# Patient Record
Sex: Female | Born: 1970 | Race: Black or African American | Hispanic: No | Marital: Single | Smoking: Never smoker
Health system: Southern US, Community
[De-identification: ages and names within clinical notes are randomized; demographics above are authoritative.]

## PROBLEM LIST (undated history)

## (undated) DIAGNOSIS — D649 Anemia, unspecified: Secondary | ICD-10-CM

## (undated) DIAGNOSIS — T8859XA Other complications of anesthesia, initial encounter: Secondary | ICD-10-CM

## (undated) HISTORY — PX: IUD REMOVAL: SHX5392

## (undated) HISTORY — PX: ENDOMETRIAL BIOPSY: SHX622

## (undated) HISTORY — PX: COLONOSCOPY: SHX174

## (undated) HISTORY — DX: Anemia, unspecified: D64.9

---

## 2015-03-06 ENCOUNTER — Other Ambulatory Visit: Payer: Self-pay

## 2015-03-06 ENCOUNTER — Other Ambulatory Visit: Payer: Self-pay | Admitting: Nurse Practitioner

## 2015-03-06 DIAGNOSIS — Z1231 Encounter for screening mammogram for malignant neoplasm of breast: Secondary | ICD-10-CM

## 2015-03-12 ENCOUNTER — Ambulatory Visit
Admission: RE | Admit: 2015-03-12 | Discharge: 2015-03-12 | Disposition: A | Discharge status: Home / Self Care | Payer: Non-veteran care | Source: Ambulatory Visit

## 2015-03-12 DIAGNOSIS — Z1231 Encounter for screening mammogram for malignant neoplasm of breast: Secondary | ICD-10-CM

## 2016-04-03 ENCOUNTER — Other Ambulatory Visit (HOSPITAL_COMMUNITY): Payer: Self-pay | Admitting: Physician Assistant

## 2016-04-03 DIAGNOSIS — Z1231 Encounter for screening mammogram for malignant neoplasm of breast: Secondary | ICD-10-CM

## 2016-04-11 ENCOUNTER — Ambulatory Visit (HOSPITAL_COMMUNITY)
Admission: RE | Admit: 2016-04-11 | Discharge: 2016-04-11 | Disposition: A | Discharge status: Home / Self Care | Payer: No Typology Code available for payment source | Source: Ambulatory Visit | Attending: Physician Assistant | Admitting: Physician Assistant

## 2016-04-11 DIAGNOSIS — Z1231 Encounter for screening mammogram for malignant neoplasm of breast: Secondary | ICD-10-CM | POA: Insufficient documentation

## 2020-06-06 ENCOUNTER — Other Ambulatory Visit: Payer: Non-veteran care | Admitting: Adult Health

## 2020-07-10 ENCOUNTER — Other Ambulatory Visit: Payer: Non-veteran care | Admitting: Adult Health

## 2020-08-27 ENCOUNTER — Encounter: Payer: Self-pay | Admitting: Adult Health

## 2020-08-27 ENCOUNTER — Ambulatory Visit (INDEPENDENT_AMBULATORY_CARE_PROVIDER_SITE_OTHER): Payer: No Typology Code available for payment source | Admitting: Adult Health

## 2020-08-27 VITALS — BP 133/86 | HR 106 | Ht 66.5 in | Wt 275.0 lb

## 2020-08-27 DIAGNOSIS — D219 Benign neoplasm of connective and other soft tissue, unspecified: Secondary | ICD-10-CM | POA: Insufficient documentation

## 2020-08-27 DIAGNOSIS — N920 Excessive and frequent menstruation with regular cycle: Secondary | ICD-10-CM

## 2020-08-27 DIAGNOSIS — Z862 Personal history of diseases of the blood and blood-forming organs and certain disorders involving the immune mechanism: Secondary | ICD-10-CM | POA: Diagnosis not present

## 2020-08-27 DIAGNOSIS — F32A Depression, unspecified: Secondary | ICD-10-CM | POA: Diagnosis not present

## 2020-08-27 MED ORDER — MEGESTROL ACETATE 40 MG PO TABS: ORAL_TABLET | ORAL | 6 refills | Status: DC

## 2020-08-27 NOTE — Progress Notes (Signed)
Patient ID: Penny Garza, female   DOB: 05-22-1971, 49 y.o.   MRN: [id number] History of Present Illness:  Penny Garza is a 49 year old black female,single, G0P0, in complaining of heavy periods. She gets iron infusions monthly. Had IUD removed in 2006, has known fibroids. She declines pap and physical today.  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  PCP [facility]  Current Medications, Allergies, Past Medical History, Past Surgical History, Family History and Social History were reviewed in [employer] record.     Review of Systems: Patient denies any hearing loss, fatigue, blurred vision, shortness of breath, chest pain, abdominal pain, problems with bowel movements, urination, or intercourse(not active). No joint pain or mood swings. Has headache with period Periods are heavy and last 5-7 days, may change every 45 minutes to 2 hours on heavy days,  +clots  Does not skip periods, but some are lighter    Physical Exam:BP 133/86 (BP Location: Left Arm, Patient Position: Sitting, Cuff Size: Normal)   Pulse (!) 106   Ht 5' 6.5" (1.689 m)   Wt 275 lb (124.7 kg)   LMP 08/23/2020 (Exact Date)   BMI 43.72 kg/m  General:  Well developed, well nourished, no acute distress Skin:  Warm and dry Neck:  Midline trachea, normal thyroid, good ROM, no lymphadenopathy Lungs; Clear to auscultation bilaterally Cardiovascular: Regular rate and rhythm Psych:  No mood changes, alert and cooperative,seems happy AA is 1 Fall risk is low PHQ 9 score is 19, no SI, she declines meds  Upstream - 08/27/20 1104      Pregnancy Intention Screening   Does the patient want to become pregnant in the next year? No    Does the patient's partner want to become pregnant in the next year? No    Would the patient like to discuss contraceptive options today? No      Contraception Wrap Up   Current Method No Method - Other Reason    End Method No Method - Other Reason     Contraception Counseling Provided No          Reviewed chart in media with her.   Impression and Plan: 1. Menorrhagia with regular cycle Will rx megace Meds ordered this encounter  Medications  . megestrol (MEGACE) 40 MG tablet    Sig: Take 3 x 5 days then 2 daily    Dispense:  60 tablet    Refill:  6    Order Specific Question:   Supervising Provider    Answer:   Tania Ade H [[id number]]   Return in 2 weeks for pap and physical   2. History of anemia  3. Fibroids  4. Depression, unspecified depression type

## 2020-09-10 ENCOUNTER — Telehealth: Payer: Self-pay | Admitting: Adult Health

## 2020-09-10 NOTE — Telephone Encounter (Signed)
Patient states that she would like nurse to follow up regarding symptoms with medication megace

## 2020-09-11 NOTE — Telephone Encounter (Signed)
Pt reports having a dull headache with taking the Megace. Just wondering if that is ok. It is helping with the bleeding. Discussed with Anderson Malta, usually not a common side effect.Will ask patient if she has had her BP checked recently.

## 2020-09-11 NOTE — Telephone Encounter (Signed)
Spoke with patient. She states that she has checked her BP and it was good. Advised to monitor sx and let us know if anything changes. Also may want to f/u with pcp if headaches continue.

## 2020-09-20 ENCOUNTER — Other Ambulatory Visit: Payer: Self-pay | Admitting: Adult Health

## 2020-10-08 ENCOUNTER — Other Ambulatory Visit: Payer: Self-pay | Admitting: Adult Health

## 2021-01-08 ENCOUNTER — Other Ambulatory Visit: Payer: Self-pay | Admitting: Adult Health

## 2021-02-13 ENCOUNTER — Ambulatory Visit (INDEPENDENT_AMBULATORY_CARE_PROVIDER_SITE_OTHER): Payer: No Typology Code available for payment source | Admitting: Adult Health

## 2021-02-13 ENCOUNTER — Other Ambulatory Visit: Payer: Self-pay

## 2021-02-13 ENCOUNTER — Encounter: Payer: Self-pay | Admitting: Adult Health

## 2021-02-13 ENCOUNTER — Other Ambulatory Visit (HOSPITAL_COMMUNITY)
Admission: RE | Admit: 2021-02-13 | Discharge: 2021-02-13 | Disposition: A | Discharge status: Home / Self Care | Payer: 59 | Source: Ambulatory Visit | Attending: Adult Health | Admitting: Adult Health

## 2021-02-13 VITALS — BP 145/82 | HR 78 | Ht 66.25 in | Wt 272.0 lb

## 2021-02-13 DIAGNOSIS — Z1211 Encounter for screening for malignant neoplasm of colon: Secondary | ICD-10-CM

## 2021-02-13 DIAGNOSIS — D219 Benign neoplasm of connective and other soft tissue, unspecified: Secondary | ICD-10-CM | POA: Diagnosis not present

## 2021-02-13 DIAGNOSIS — N920 Excessive and frequent menstruation with regular cycle: Secondary | ICD-10-CM

## 2021-02-13 DIAGNOSIS — Z01419 Encounter for gynecological examination (general) (routine) without abnormal findings: Secondary | ICD-10-CM | POA: Insufficient documentation

## 2021-02-13 DIAGNOSIS — Z862 Personal history of diseases of the blood and blood-forming organs and certain disorders involving the immune mechanism: Secondary | ICD-10-CM

## 2021-02-13 DIAGNOSIS — Z1231 Encounter for screening mammogram for malignant neoplasm of breast: Secondary | ICD-10-CM

## 2021-02-13 DIAGNOSIS — K439 Ventral hernia without obstruction or gangrene: Secondary | ICD-10-CM | POA: Diagnosis not present

## 2021-02-13 LAB — HEMOCCULT GUIAC POC 1CARD (OFFICE): Fecal Occult Blood, POC: NEGATIVE

## 2021-02-13 NOTE — Progress Notes (Signed)
Patient ID: Penny Garza, female   DOB: [birth year], 50 y.o.   MRN: [id number] History of Present Illness: Penny Garza is a 50 year old black female, single, G0P0, in for well woman gyn exam and pap. 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 She bleeds 7 days, with 1-2 heavy days, changes big pads every hour. She had IUD in past and it was removed.  She is seen at [place]. PCP is [name] PA.  Current Medications, Allergies, Past Medical History, Past Surgical History, Family History and Social History were reviewed in [employer] record.     Review of Systems: Patient denies any headaches, hearing loss, fatigue, blurred vision, shortness of breath, chest pain, abdominal pain, problems with bowel movements, urination(she leaks urine some), or intercourse(not active). No joint pain or mood swings.    Physical Exam:BP (!) 145/82 (BP Location: Left Arm, Patient Position: Sitting, Cuff Size: Large)   Pulse 78   Ht 5' 6.25" (1.683 m)   Wt 272 lb (123.4 kg)   LMP 02/03/2021   BMI 43.57 kg/m  General:  Well developed, well nourished, no acute distress Skin:  Warm and dry Neck:  Midline trachea, normal thyroid, good ROM, no lymphadenopathy Lungs; Clear to auscultation bilaterally Breast:  No dominant palpable mass, retraction, or nipple discharge,large, has indents in shoulders Cardiovascular: Regular rate and rhythm Abdomen:  Soft, non tender, no hepatosplenomegaly, has abdominal hernia  Pelvic:  External genitalia is normal in appearance, no lesions.  The vagina is normal in appearance. Urethra has no lesions or masses. The cervix is smooth, pap with HR HPV genotyping performed.  Uterus is felt to be 20+ weeks .  No adnexal masses or tenderness noted.Bladder is non tender, no masses felt. Rectal: Good sphincter tone, no polyps, or hemorrhoids felt.  Hemoccult negative. Extremities/musculoskeletal:   No swelling or varicosities noted, no clubbing or cyanosis Psych:  No mood changes, alert and cooperative,seems happy AA is 1 Fall risk is moderate PHQ 9 score is 10 no SI, declines meds GAD 7 score is 13  Upstream - 02/13/21 1422      Pregnancy Intention Screening   Does the patient want to become pregnant in the next year? No    Does the patient's partner want to become pregnant in the next year? No    Would the patient like to discuss contraceptive options today? No      Contraception Wrap Up   Current Method Abstinence    End Method Abstinence    Contraception Counseling Provided No         Examination chaperoned by [name] LPN.   Impression and Plan: 1. Encounter for gynecological examination with Papanicolaou smear of cervix Pap sent  Physical in 1 year Pap in 3 years if normal [facility]  2. Encounter for screening fecal occult blood testing  3. Fibroids Will get GYN/pelvic US to assess 02/27/21 at 4 pm  Return in about 5 days later to talk to [name] about surgical options   4. Abdominal wall hernia  5. Screening mammogram for breast cancer Call the [facility] about scheduling mammogram   6. Menorrhagia with regular cycle  7. History of anemia Has iron infusion soon

## 2021-02-15 LAB — CYTOLOGY - PAP
Comment: NEGATIVE
Diagnosis: NEGATIVE
High risk HPV: NEGATIVE

## 2021-02-27 ENCOUNTER — Other Ambulatory Visit: Payer: Self-pay

## 2021-02-27 ENCOUNTER — Ambulatory Visit (INDEPENDENT_AMBULATORY_CARE_PROVIDER_SITE_OTHER): Payer: No Typology Code available for payment source

## 2021-02-27 DIAGNOSIS — D219 Benign neoplasm of connective and other soft tissue, unspecified: Secondary | ICD-10-CM | POA: Diagnosis not present

## 2021-02-27 NOTE — Progress Notes (Addendum)
PELVIC US TA only: enlarged heterogenous uterus with mult.fibroids,approximate uterine vol 4,978 cc,simple right ovarian cyst 5.1 x 2.8 x 3.2 cm,simple left ovarian cyst 3.2 x 1.5 x 2.6 cm,homogeneous thickened endometrium with a trace of fluid within the endometrial cavity,EEC 18.6 mm-27.6 mm,no free fluid,limited view because of the size of fibroids

## 2021-03-14 ENCOUNTER — Other Ambulatory Visit: Payer: Self-pay

## 2021-03-14 ENCOUNTER — Encounter: Payer: Self-pay | Admitting: Obstetrics & Gynecology

## 2021-03-14 ENCOUNTER — Ambulatory Visit (INDEPENDENT_AMBULATORY_CARE_PROVIDER_SITE_OTHER): Payer: No Typology Code available for payment source | Admitting: Obstetrics & Gynecology

## 2021-03-14 VITALS — BP 142/92 | HR 94 | Ht 66.5 in | Wt 276.0 lb

## 2021-03-14 DIAGNOSIS — D219 Benign neoplasm of connective and other soft tissue, unspecified: Secondary | ICD-10-CM | POA: Diagnosis not present

## 2021-03-14 DIAGNOSIS — D500 Iron deficiency anemia secondary to blood loss (chronic): Secondary | ICD-10-CM | POA: Diagnosis not present

## 2021-03-14 NOTE — Progress Notes (Signed)
Follow up appointment for results  Chief Complaint  Patient presents with  . Follow-up    [place]    Blood pressure (!) 142/92, pulse 94, height 5' 6.5" (1.689 m), weight 276 lb (125.2 kg), last menstrual period 02/27/2021.   Worsening menstrual cycles, heavy bleeding required iron infusion  US PELVIS (TRANSABDOMINAL ONLY)  Result Date: 02/27/2021  [facility] [address] [place] [address]                                                                                        GYNECOLOGIC SONOGRAM Penny Garza is a 50 y.o. G0P0000 Patient's last menstrual period was 02/03/2021.She is here for a pelvic sonogram for fibroids,anemia, and menorrhagia. Uterus                      34 x 14 x 20 cm, Total uterine volume (approximate) 4,978 cc,enlarged heterogenous uterus with mult.fibroids Endometrium          18.6-27.6 mm, symmetrical, homogeneous thickened endometrium with a trace of fluid within the endometrial cavity Right ovary             5.1 x 2.8 x 4.3 cm, simple right ovarian cyst 5.1 x 2.8 x 3.2 cm Left ovary                4.2 x 2.9 x 4 cm,simple left cyst 3.2 x 1.5 x 2.6 cm Technician Comments: PELVIC US TA only: enlarged heterogenous uterus with mult.fibroids,approximate uterine vol 4,978 cc,simple right ovarian cyst 5.1 x 2.8 x 3.2 cm,simple left ovarian cyst 3.2 x 1.5 x 2.6 cm,homogeneous thickened endometrium with a trace of fluid within the endometrial cavity,EEC 18.6 mm-27.6 mm,no free fluid,limited view because of the size of fibroids Silver Huguenin 02/27/2021 5:19 PM Clinical Impression and recommendations: I have reviewed the sonogram results above, combined with the patient's current clinical course, below are my impressions and any appropriate recommendations for management based on the sonographic findings. Uterus is markedly enlarged, 5000 cc, by uterine fibroids Endometrium is thickened consistent with her menstrual history Both ovaries have simple  cysts, normal sized otherwise I am seeing patient next week to discuss surgical management of her 5000 cc uterus Florian Buff 02/27/2021 9:43 PM    Exam 36 weeks size by bimanual exam width extends to abdominal sidewalls  MEDS ordered this encounter: No orders of the defined types were placed in this encounter.   Orders for this encounter: No orders of the defined types were placed in this encounter.   Impression: 1. Fibroids, 5000 gram uterus Discussed options  2. Iron deficiency anemia due to chronic blood loss S/P iron infusion   Plan: Supracervical abdominal hysterectomy, considering BSO vs BS Will tentatively set up for 04/24/21  Follow Up: Return in about 7 weeks (around 05/03/2021) for [place], with [name].      All questions were answered.  Past Medical History:  Diagnosis Date  . Anemia     Past Surgical History:  Procedure Laterality Date  . IUD REMOVAL      OB History  Gravida  0   Para  0   Term  0   Preterm  0   AB  0   Living  0     SAB  0   IAB  0   Ectopic  0   Multiple  0   Live Births  0           Allergies  Allergen Reactions  . Codeine Anaphylaxis  . Iron Sucrose Rash  . Azithromycin   . Milk-Related Compounds Other (See Comments)    Stomach pain    Social History   Socioeconomic History  . Marital status: Single    Spouse name: Not on file  . Number of children: Not on file  . Years of education: Not on file  . Highest education level: Not on file  Occupational History  . Not on file  Tobacco Use  . Smoking status: Never Smoker  . Smokeless tobacco: Never Used  Vaping Use  . Vaping Use: Never used  Substance and Sexual Activity  . Alcohol use: Yes  . Drug use: Never  . Sexual activity: Not Currently    Birth control/protection: None  Other Topics Concern  . Not on file  Social History Narrative  . Not on file   Social Determinants of Health   Financial Resource Strain: Medium Risk  .  Difficulty of Paying Living Expenses: Somewhat hard  Food Insecurity: No Food Insecurity  . Worried About Charity fundraiser in the Last Year: Never true  . Ran Out of Food in the Last Year: Never true  Transportation Needs: Unmet Transportation Needs  . Lack of Transportation (Medical): Yes  . Lack of Transportation (Non-Medical): Yes  Physical Activity: Insufficiently Active  . Days of Exercise per Week: 2 days  . Minutes of Exercise per Session: 30 min  Stress: No Stress Concern Present  . Feeling of Stress : Only a little  Social Connections: Moderately Isolated  . Frequency of Communication with Friends and Family: More than three times a week  . Frequency of Social Gatherings with Friends and Family: Twice a week  . Attends Religious Services: More than 4 times per year  . Active Member of Clubs or Organizations: No  . Attends Archivist Meetings: Never  . Marital Status: Never married    Family History  Problem Relation Age of Onset  . Diabetes Paternal Grandfather   . Diabetes Paternal Grandmother   . Diabetes Maternal Grandfather   . Diabetes Father   . Hypertension Father   . Heart attack Mother

## 2021-04-22 ENCOUNTER — Other Ambulatory Visit (HOSPITAL_COMMUNITY): Payer: Non-veteran care

## 2021-04-26 NOTE — Patient Instructions (Signed)
Penny Garza  04/26/2021     @PREFPERIOPPHARMACY @   Your procedure is scheduled on 05/01/2021.   Report to [facility] a  1224  A.M.  Call this number if you have problems the morning of surgery:  [phone]   Remember:  Do not eat after midnight.  You may drink clear liquids until 0515 .  Clear liquids allowed are:                    Water, Juice (non-citric and without pulp - diabetics please choose diet or no sugar options), Carbonated beverages - (diabetics please choose diet or no sugar options), Clear Tea, Black Coffee only (no creamer, milk or cream including half and half), Plain Jell-O only (diabetics please choose diet or no sugar options), Gatorade (diabetics please choose diet or no sugar options), and Plain Popsicles only    At 0515 drink your carb drink and then having nothing else after this to drink.   Take these medicines the morning of surgery with A SIP OF WATER      None     Please brush your teeth.  Do not wear jewelry, make-up or nail polish.  Do not wear lotions, powders, or perfumes, or deodorant.  Do not shave 48 hours prior to surgery.  Men may shave face and neck.  Do not bring valuables to the hospital.  [facility] is not responsible for any belongings or valuables.  Contacts, dentures or bridgework may not be worn into surgery.  Leave your suitcase in the car.  After surgery it may be brought to your room.  For patients admitted to the hospital, discharge time will be determined by your treatment team.  Patients discharged the day of surgery will not be allowed to drive home and must have someone with them for 24 hours.    Special instructions:  DO NOT smoke tobacco or vape for 24 hours before your procedure.  Please read over the following fact sheets that you were given. Pain Booklet, Coughing and Deep Breathing, Blood Transfusion Information, Surgical Site Infection Prevention, Anesthesia Post-op Instructions, and Care and  Recovery After Surgery      Abdominal Hysterectomy, Care After The following information offers guidance on how to care for yourself after your procedure. Your doctor may also give you more specific instructions. Ifyou have problems or questions, contact your doctor. What can I expect after the procedure? After the procedure, it is common to have: Pain. Tiredness. No desire to eat. Less interest in sex. Bleeding and fluid (discharge) from your vagina. You may need to use a pad after this procedure. Trouble pooping (constipation). Feelings of sadness or other emotions. Follow these instructions at home: Medicines Take over-the-counter and prescription medicines only as told by your doctor. Do not take aspirin or NSAIDs, such as ibuprofen. These medicines can cause bleeding. If you were prescribed an antibiotic medicine, take it as told by your doctor. Do not stop using the antibiotic even if you start to feel better. If told, take steps to prevent problems with pooping (constipation). You may need to: Drink enough fluid to keep your pee (urine) pale yellow. Take medicines. You will be told what medicines to take. Eat foods that are high in fiber. These include beans, whole grains, and fresh fruits and vegetables. Limit foods that are high in fat and sugar. These include fried or sweet foods. Ask your doctor if you should avoid driving  or using machines while you are taking your medicine. Surgical cut care  Follow instructions from your doctor about how to take care of your cut from surgery (incision). Make sure you: Wash your hands with soap and water for at least 20 seconds before and after you change your bandage. If you cannot use soap and water, use hand sanitizer. Change your bandage. Leave stitches or skin glue in place for at least two weeks. Leave tape strips alone unless you are told to take them off. You may trim the edges of the tape strips if they curl up. Keep the bandage  dry until your doctor says it can be taken off. Check your incision every day for signs of infection. Check for: More redness, swelling, or pain. Fluid or blood. Warmth. Pus or a bad smell.  Activity  Rest as told by your doctor. Get up to take short walks every 1 to 2 hours. Ask for help if you feel weak or unsteady. Do not lift anything that is heavier than 10 lb (4.5 kg), or the limit that you are told. Follow your doctor's advice about exercise, driving, and general activities. Return to your normal activities when your doctor says that it is safe.  Lifestyle Do not douche, use tampons, or have sex for at least 6 weeks or as told by your doctor. Do not drink alcohol until your doctor says it is okay. Do not smoke or use any products that contain nicotine or tobacco. These can delay healing after surgery. If you need help quitting, ask your doctor. General instructions Do not take baths, swim, or use a hot tub. Ask your doctor about taking showers or sponge baths. Try to have a responsible adult at home with you for the first 1-2 weeks to help with your daily chores. Wear tight-fitting (compression) stockings as told by your doctor. Keep all follow-up visits. Contact a doctor if: You have chills or a fever. You have any of these signs of infection around your cut: More redness, swelling, or pain. Fluid or blood. Warmth. Pus or a bad smell. Your cut breaks open. You feel dizzy or light-headed. You have pain or bleeding when you pee. You keep having watery poop (diarrhea). You keep feeling like you may vomit or you keep vomiting. You have fluid coming from your vagina that is not normal. You have any type of reaction to your medicine that is not normal, like a rash, or you develop an allergy to your medicine. Your pain medicine does not help. Get help right away if: You have a fever and your symptoms get worse suddenly. You have very bad pain in your belly (abdomen). You  are short of breath. You faint. You have pain, swelling, or redness of your leg. You bleed a lot from your vagina and you see blood clots. Summary It is normal to have some pain, tiredness, and fluid that comes from your vagina. Do not take baths, swim, or use a hot tub. Ask your doctor about taking showers or sponge baths. Do not lift anything that is heavier than 10 lb (4.5 kg), or the limit that you are told. Follow your doctor's advice about exercise, driving, and general activities. Try to have a responsible adult at home with you for the first 1-2 weeks to help with your daily chores. This information is not intended to replace advice given to you by your health care provider. Make sure you discuss any questions you have with your healthcare provider.  Document Revised: 07/05/2020 Document Reviewed: 07/05/2020 Elsevier Patient Education  [place] Anesthesia, Adult, Care After This sheet gives you information about how to care for yourself after your procedure. Your health care provider may also give you more specific instructions. If you have problems or questions, contact your health careprovider. What can I expect after the procedure? After the procedure, the following side effects are common: Pain or discomfort at the IV site. Nausea. Vomiting. Sore throat. Trouble concentrating. Feeling cold or chills. Feeling weak or tired. Sleepiness and fatigue. Soreness and body aches. These side effects can affect parts of the body that were not involved in surgery. Follow these instructions at home: For the time period you were told by your health care provider:  Rest. Do not participate in activities where you could fall or become injured. Do not drive or use machinery. Do not drink alcohol. Do not take sleeping pills or medicines that cause drowsiness. Do not make important decisions or sign legal documents. Do not take care of children on your own.  Eating and  drinking Follow any instructions from your health care provider about eating or drinking restrictions. When you feel hungry, start by eating small amounts of foods that are soft and easy to digest (bland), such as toast. Gradually return to your regular diet. Drink enough fluid to keep your urine pale yellow. If you vomit, rehydrate by drinking water, juice, or clear broth. General instructions If you have sleep apnea, surgery and certain medicines can increase your risk for breathing problems. Follow instructions from your health care provider about wearing your sleep device: Anytime you are sleeping, including during daytime naps. While taking prescription pain medicines, sleeping medicines, or medicines that make you drowsy. Have a responsible adult stay with you for the time you are told. It is important to have someone help care for you until you are awake and alert. Return to your normal activities as told by your health care provider. Ask your health care provider what activities are safe for you. Take over-the-counter and prescription medicines only as told by your health care provider. If you smoke, do not smoke without supervision. Keep all follow-up visits as told by your health care provider. This is important. Contact a health care provider if: You have nausea or vomiting that does not get better with medicine. You cannot eat or drink without vomiting. You have pain that does not get better with medicine. You are unable to pass urine. You develop a skin rash. You have a fever. You have redness around your IV site that gets worse. Get help right away if: You have difficulty breathing. You have chest pain. You have blood in your urine or stool, or you vomit blood. Summary After the procedure, it is common to have a sore throat or nausea. It is also common to feel tired. Have a responsible adult stay with you for the time you are told. It is important to have someone help care  for you until you are awake and alert. When you feel hungry, start by eating small amounts of foods that are soft and easy to digest (bland), such as toast. Gradually return to your regular diet. Drink enough fluid to keep your urine pale yellow. Return to your normal activities as told by your health care provider. Ask your health care provider what activities are safe for you. This information is not intended to replace advice given to you by your health care provider. Make sure you  discuss any questions you have with your healthcare provider. Document Revised: 07/19/2020 Document Reviewed: 02/16/2020 Elsevier Patient Education  2022 [place]. How to Use Chlorhexidine for Bathing Chlorhexidine gluconate (CHG) is a germ-killing (antiseptic) solution that is used to clean the skin. It can get rid of the bacteria that normally live on the skin and can keep them away for about 24 hours. To clean your skin with CHG, you may be given: A CHG solution to use in the shower or as part of a sponge bath. A prepackaged cloth that contains CHG. Cleaning your skin with CHG may help lower the risk for infection: While you are staying in the intensive care unit of the hospital. If you have a vascular access, such as a central line, to provide short-term or long-term access to your veins. If you have a catheter to drain urine from your bladder. If you are on a ventilator. A ventilator is a machine that helps you breathe by moving air in and out of your lungs. After surgery. What are the risks? Risks of using CHG include: A skin reaction. Hearing loss, if CHG gets in your ears. Eye injury, if CHG gets in your eyes and is not rinsed out. The CHG product catching fire. Make sure that you avoid smoking and flames after applying CHG to your skin. Do not use CHG: If you have a chlorhexidine allergy or have previously reacted to chlorhexidine. On babies younger than 12 months of age. How to use CHG  solution Use CHG only as told by your health care provider, and follow the instructions on the label. Use the full amount of CHG as directed. Usually, this is one bottle. During a shower Follow these steps when using CHG solution during a shower (unless your health care provider gives you different instructions): Start the shower. Use your normal soap and shampoo to wash your face and hair. Turn off the shower or move out of the shower stream. Pour the CHG onto a clean washcloth. Do not use any type of brush or rough-edged sponge. Starting at your neck, lather your body down to your toes. Make sure you follow these instructions: If you will be having surgery, pay special attention to the part of your body where you will be having surgery. Scrub this area for at least 1 minute. Do not use CHG on your head or face. If the solution gets into your ears or eyes, rinse them well with water. Avoid your genital area. Avoid any areas of skin that have broken skin, cuts, or scrapes. Scrub your back and under your arms. Make sure to wash skin folds. Let the lather sit on your skin for 1-2 minutes or as long as told by your health care provider. Thoroughly rinse your entire body in the shower. Make sure that all body creases and crevices are rinsed well. Dry off with a clean towel. Do not put any substances on your body afterward--such as powder, lotion, or perfume--unless you are told to do so by your health care provider. Only use lotions that are recommended by the manufacturer. Put on clean clothes or pajamas. If it is the night before your surgery, sleep in clean sheets.  During a sponge bath Follow these steps when using CHG solution during a sponge bath (unless your health care provider gives you different instructions): Use your normal soap and shampoo to wash your face and hair. Pour the CHG onto a clean washcloth. Starting at your neck, lather your body  down to your toes. Make sure you follow  these instructions: If you will be having surgery, pay special attention to the part of your body where you will be having surgery. Scrub this area for at least 1 minute. Do not use CHG on your head or face. If the solution gets into your ears or eyes, rinse them well with water. Avoid your genital area. Avoid any areas of skin that have broken skin, cuts, or scrapes. Scrub your back and under your arms. Make sure to wash skin folds. Let the lather sit on your skin for 1-2 minutes or as long as told by your health care provider. Using a different clean, wet washcloth, thoroughly rinse your entire body. Make sure that all body creases and crevices are rinsed well. Dry off with a clean towel. Do not put any substances on your body afterward--such as powder, lotion, or perfume--unless you are told to do so by your health care provider. Only use lotions that are recommended by the manufacturer. Put on clean clothes or pajamas. If it is the night before your surgery, sleep in clean sheets. How to use CHG prepackaged cloths Only use CHG cloths as told by your health care provider, and follow the instructions on the label. Use the CHG cloth on clean, dry skin. Do not use the CHG cloth on your head or face unless your health care provider tells you to. When washing with the CHG cloth: Avoid your genital area. Avoid any areas of skin that have broken skin, cuts, or scrapes. Before surgery Follow these steps when using a CHG cloth to clean before surgery (unless your health care provider gives you different instructions): Using the CHG cloth, vigorously scrub the part of your body where you will be having surgery. Scrub using a back-and-forth motion for 3 minutes. The area on your body should be completely wet with CHG when you are done scrubbing. Do not rinse. Discard the cloth and let the area air-dry. Do not put any substances on the area afterward, such as powder, lotion, or perfume. Put on clean  clothes or pajamas. If it is the night before your surgery, sleep in clean sheets.  For general bathing Follow these steps when using CHG cloths for general bathing (unless your health care provider gives you different instructions). Use a separate CHG cloth for each area of your body. Make sure you wash between any folds of skin and between your fingers and toes. Wash your body in the following order, switching to a new cloth after each step: The front of your neck, shoulders, and chest. Both of your arms, under your arms, and your hands. Your stomach and groin area, avoiding the genitals. Your right leg and foot. Your left leg and foot. The back of your neck, your back, and your buttocks. Do not rinse. Discard the cloth and let the area air-dry. Do not put any substances on your body afterward--such as powder, lotion, or perfume--unless you are told to do so by your health care provider. Only use lotions that are recommended by the manufacturer. Put on clean clothes or pajamas. Contact a health care provider if: Your skin gets irritated after scrubbing. You have questions about using your solution or cloth. Get help right away if: Your eyes become very red or swollen. Your eyes itch badly. Your skin itches badly and is red or swollen. Your hearing changes. You have trouble seeing. You have swelling or tingling in your mouth or throat. You have  trouble breathing. You swallow any chlorhexidine. Summary Chlorhexidine gluconate (CHG) is a germ-killing (antiseptic) solution that is used to clean the skin. Cleaning your skin with CHG may help to lower your risk for infection. You may be given CHG to use for bathing. It may be in a bottle or in a prepackaged cloth to use on your skin. Carefully follow your health care provider's instructions and the instructions on the product label. Do not use CHG if you have a chlorhexidine allergy. Contact your health care provider if your skin gets  irritated after scrubbing. This information is not intended to replace advice given to you by your health care provider. Make sure you discuss any questions you have with your healthcare provider. Document Revised: 03/16/2020 Document Reviewed: 04/20/2020 Elsevier Patient Education  [place].

## 2021-04-28 ENCOUNTER — Other Ambulatory Visit: Payer: Self-pay | Admitting: Obstetrics & Gynecology

## 2021-04-30 ENCOUNTER — Other Ambulatory Visit: Payer: Self-pay

## 2021-04-30 ENCOUNTER — Encounter (HOSPITAL_COMMUNITY): Payer: Self-pay

## 2021-04-30 ENCOUNTER — Other Ambulatory Visit (HOSPITAL_COMMUNITY): Admission: RE | Admit: 2021-04-30 | Payer: No Typology Code available for payment source | Source: Ambulatory Visit

## 2021-04-30 ENCOUNTER — Encounter (HOSPITAL_COMMUNITY)
Admission: RE | Admit: 2021-04-30 | Discharge: 2021-04-30 | Disposition: A | Discharge status: Home / Self Care | Payer: No Typology Code available for payment source | Source: Ambulatory Visit | Attending: Obstetrics & Gynecology | Admitting: Obstetrics & Gynecology

## 2021-04-30 DIAGNOSIS — Z01812 Encounter for preprocedural laboratory examination: Secondary | ICD-10-CM | POA: Insufficient documentation

## 2021-04-30 DIAGNOSIS — Z20822 Contact with and (suspected) exposure to covid-19: Secondary | ICD-10-CM | POA: Insufficient documentation

## 2021-04-30 HISTORY — DX: Other complications of anesthesia, initial encounter: T88.59XA

## 2021-04-30 LAB — CBC
HCT: 35.3 % — ABNORMAL LOW (ref 36.0–46.0)
Hemoglobin: 10.9 g/dL — ABNORMAL LOW (ref 12.0–15.0)
MCH: 29.7 pg (ref 26.0–34.0)
MCHC: 30.9 g/dL (ref 30.0–36.0)
MCV: 96.2 fL (ref 80.0–100.0)
Platelets: 341 10*3/uL (ref 150–400)
RBC: 3.67 MIL/uL — ABNORMAL LOW (ref 3.87–5.11)
RDW: 16.2 % — ABNORMAL HIGH (ref 11.5–15.5)
WBC: 6.6 10*3/uL (ref 4.0–10.5)
nRBC: 0 % (ref 0.0–0.2)

## 2021-04-30 LAB — COMPREHENSIVE METABOLIC PANEL
ALT: 15 U/L (ref 0–44)
AST: 18 U/L (ref 15–41)
Albumin: 3.6 g/dL (ref 3.5–5.0)
Alkaline Phosphatase: 77 U/L (ref 38–126)
Anion gap: 8 (ref 5–15)
BUN: 10 mg/dL (ref 6–20)
CO2: 23 mmol/L (ref 22–32)
Calcium: 8.7 mg/dL — ABNORMAL LOW (ref 8.9–10.3)
Chloride: 106 mmol/L (ref 98–111)
Creatinine, Ser: 0.55 mg/dL (ref 0.44–1.00)
GFR, Estimated: >60 mL/min (ref >60)
Glucose, Bld: 96 mg/dL (ref 70–99)
Potassium: 3.7 mmol/L (ref 3.5–5.1)
Sodium: 137 mmol/L (ref 135–145)
Total Bilirubin: 0.5 mg/dL (ref 0.3–1.2)
Total Protein: 7.3 g/dL (ref 6.5–8.1)

## 2021-04-30 LAB — URINALYSIS, ROUTINE W REFLEX MICROSCOPIC
Bilirubin Urine: NEGATIVE
Glucose, UA: NEGATIVE mg/dL
Ketones, ur: NEGATIVE mg/dL
Leukocytes,Ua: NEGATIVE
Nitrite: NEGATIVE
Protein, ur: 100 mg/dL — AB
Specific Gravity, Urine: 1.017 (ref 1.005–1.030)
pH: 6 (ref 5.0–8.0)

## 2021-04-30 LAB — RAPID HIV SCREEN (HIV 1/2 AB+AG)
HIV 1/2 Antibodies: NONREACTIVE
HIV-1 P24 Antigen - HIV24: NONREACTIVE

## 2021-04-30 LAB — HCG, QUANTITATIVE, PREGNANCY: hCG, Beta Chain, Quant, S: <1 m[IU]/mL (ref <5)

## 2021-04-30 LAB — SARS CORONAVIRUS 2 (TAT 6-24 HRS): SARS Coronavirus 2: NEGATIVE

## 2021-05-01 ENCOUNTER — Encounter (HOSPITAL_COMMUNITY): Payer: Self-pay | Admitting: Obstetrics & Gynecology

## 2021-05-01 ENCOUNTER — Inpatient Hospital Stay (HOSPITAL_COMMUNITY)
Admission: RE | Admit: 2021-05-01 | Discharge: 2021-05-03 | DRG: 742 | Disposition: A | Discharge status: Home / Self Care | Payer: No Typology Code available for payment source | Attending: Obstetrics & Gynecology | Admitting: Obstetrics & Gynecology

## 2021-05-01 ENCOUNTER — Other Ambulatory Visit: Payer: Self-pay

## 2021-05-01 ENCOUNTER — Inpatient Hospital Stay (HOSPITAL_COMMUNITY): Payer: No Typology Code available for payment source | Admitting: Certified Registered Nurse Anesthetist

## 2021-05-01 ENCOUNTER — Encounter (HOSPITAL_COMMUNITY)
Admission: RE | Disposition: A | Discharge status: Home / Self Care | Payer: Self-pay | Source: Home / Self Care | Attending: Obstetrics & Gynecology

## 2021-05-01 DIAGNOSIS — N921 Excessive and frequent menstruation with irregular cycle: Secondary | ICD-10-CM | POA: Diagnosis present

## 2021-05-01 DIAGNOSIS — Z885 Allergy status to narcotic agent status: Secondary | ICD-10-CM

## 2021-05-01 DIAGNOSIS — D259 Leiomyoma of uterus, unspecified: Secondary | ICD-10-CM | POA: Diagnosis present

## 2021-05-01 DIAGNOSIS — D62 Acute posthemorrhagic anemia: Secondary | ICD-10-CM | POA: Diagnosis not present

## 2021-05-01 DIAGNOSIS — D282 Benign neoplasm of uterine tubes and ligaments: Secondary | ICD-10-CM | POA: Diagnosis present

## 2021-05-01 DIAGNOSIS — Z881 Allergy status to other antibiotic agents status: Secondary | ICD-10-CM | POA: Diagnosis not present

## 2021-05-01 DIAGNOSIS — Z20822 Contact with and (suspected) exposure to covid-19: Secondary | ICD-10-CM | POA: Diagnosis present

## 2021-05-01 DIAGNOSIS — Z90710 Acquired absence of both cervix and uterus: Secondary | ICD-10-CM

## 2021-05-01 DIAGNOSIS — Z91048 Other nonmedicinal substance allergy status: Secondary | ICD-10-CM

## 2021-05-01 DIAGNOSIS — D509 Iron deficiency anemia, unspecified: Secondary | ICD-10-CM | POA: Diagnosis present

## 2021-05-01 DIAGNOSIS — Z91011 Allergy to milk products: Secondary | ICD-10-CM | POA: Diagnosis not present

## 2021-05-01 DIAGNOSIS — K660 Peritoneal adhesions (postprocedural) (postinfection): Secondary | ICD-10-CM | POA: Diagnosis present

## 2021-05-01 DIAGNOSIS — Z90722 Acquired absence of ovaries, bilateral: Secondary | ICD-10-CM

## 2021-05-01 DIAGNOSIS — D219 Benign neoplasm of connective and other soft tissue, unspecified: Secondary | ICD-10-CM | POA: Diagnosis present

## 2021-05-01 HISTORY — PX: BILATERAL SALPINGECTOMY: SHX5743

## 2021-05-01 HISTORY — PX: SUPRACERVICAL ABDOMINAL HYSTERECTOMY: SHX5393

## 2021-05-01 LAB — POCT I-STAT, CHEM 8
BUN: 11 mg/dL (ref 6–20)
Calcium, Ion: 1.18 mmol/L (ref 1.15–1.40)
Chloride: 104 mmol/L (ref 98–111)
Creatinine, Ser: 0.7 mg/dL (ref 0.44–1.00)
Glucose, Bld: 165 mg/dL — ABNORMAL HIGH (ref 70–99)
HCT: 25 % — ABNORMAL LOW (ref 36.0–46.0)
Hemoglobin: 8.5 g/dL — ABNORMAL LOW (ref 12.0–15.0)
Potassium: 4.3 mmol/L (ref 3.5–5.1)
Sodium: 139 mmol/L (ref 135–145)
TCO2: 23 mmol/L (ref 22–32)

## 2021-05-01 LAB — ABO/RH: ABO/RH(D): O POS

## 2021-05-01 LAB — CBC
HCT: 25.2 % — ABNORMAL LOW (ref 36.0–46.0)
Hemoglobin: 7.7 g/dL — ABNORMAL LOW (ref 12.0–15.0)
MCH: 30.2 pg (ref 26.0–34.0)
MCHC: 30.6 g/dL (ref 30.0–36.0)
MCV: 98.8 fL (ref 80.0–100.0)
Platelets: 307 10*3/uL (ref 150–400)
RBC: 2.55 MIL/uL — ABNORMAL LOW (ref 3.87–5.11)
RDW: 16.5 % — ABNORMAL HIGH (ref 11.5–15.5)
WBC: 16.3 10*3/uL — ABNORMAL HIGH (ref 4.0–10.5)
nRBC: 0 % (ref 0.0–0.2)

## 2021-05-01 SURGERY — HYSTERECTOMY, SUPRACERVICAL, ABDOMINAL
Anesthesia: General | Site: Abdomen

## 2021-05-01 MED ORDER — CEFAZOLIN SODIUM-DEXTROSE 2-4 GM/100ML-% IV SOLN
INTRAVENOUS | Status: AC
  Filled 2021-05-01: qty 100

## 2021-05-01 MED ORDER — MIDAZOLAM HCL 2 MG/2ML IJ SOLN
INTRAMUSCULAR | Status: DC | PRN
  Administered 2021-05-01: 2 mg via INTRAVENOUS

## 2021-05-01 MED ORDER — ALBUMIN HUMAN 25 % IV SOLN
INTRAVENOUS | Status: AC
  Filled 2021-05-01: qty 50

## 2021-05-01 MED ORDER — GABAPENTIN 300 MG PO CAPS
300.0000 mg | ORAL_CAPSULE | Freq: Three times a day (TID) | ORAL | Status: DC
  Administered 2021-05-01 – 2021-05-03 (×4): 300 mg via ORAL
  Filled 2021-05-01 (×4): qty 1

## 2021-05-01 MED ORDER — DIPHENHYDRAMINE HCL 50 MG/ML IJ SOLN
INTRAMUSCULAR | Status: AC
  Filled 2021-05-01: qty 1

## 2021-05-01 MED ORDER — PROPOFOL 10 MG/ML IV BOLUS
INTRAVENOUS | Status: AC
  Filled 2021-05-01: qty 20

## 2021-05-01 MED ORDER — FENTANYL CITRATE (PF) 100 MCG/2ML IJ SOLN: 50.0000 ug | INTRAMUSCULAR | Status: DC | PRN

## 2021-05-01 MED ORDER — SODIUM CHLORIDE 0.9 % IV SOLN
8.0000 mg | Freq: Four times a day (QID) | INTRAVENOUS | Status: DC | PRN
  Filled 2021-05-01: qty 4

## 2021-05-01 MED ORDER — ENOXAPARIN SODIUM 40 MG/0.4ML IJ SOSY: 40.0000 mg | PREFILLED_SYRINGE | INTRAMUSCULAR | Status: DC

## 2021-05-01 MED ORDER — FENTANYL CITRATE (PF) 250 MCG/5ML IJ SOLN
INTRAMUSCULAR | Status: AC
  Filled 2021-05-01: qty 5

## 2021-05-01 MED ORDER — ROCURONIUM BROMIDE 10 MG/ML (PF) SYRINGE
PREFILLED_SYRINGE | INTRAVENOUS | Status: AC
  Filled 2021-05-01: qty 20

## 2021-05-01 MED ORDER — CHLORHEXIDINE GLUCONATE 0.12 % MT SOLN
15.0000 mL | Freq: Once | OROMUCOSAL | Status: AC
  Administered 2021-05-01: 15 mL via OROMUCOSAL

## 2021-05-01 MED ORDER — KETOROLAC TROMETHAMINE 30 MG/ML IJ SOLN
30.0000 mg | Freq: Once | INTRAMUSCULAR | Status: AC
  Administered 2021-05-01: 30 mg via INTRAVENOUS

## 2021-05-01 MED ORDER — FENTANYL CITRATE (PF) 100 MCG/2ML IJ SOLN: 25.0000 ug | INTRAMUSCULAR | Status: DC | PRN

## 2021-05-01 MED ORDER — KETOROLAC TROMETHAMINE 30 MG/ML IJ SOLN
INTRAMUSCULAR | Status: AC
  Filled 2021-05-01: qty 1

## 2021-05-01 MED ORDER — ROCURONIUM BROMIDE 10 MG/ML (PF) SYRINGE
PREFILLED_SYRINGE | INTRAVENOUS | Status: AC
  Filled 2021-05-01: qty 10

## 2021-05-01 MED ORDER — ENOXAPARIN SODIUM 60 MG/0.6ML IJ SOSY
60.0000 mg | PREFILLED_SYRINGE | INTRAMUSCULAR | Status: DC
  Filled 2021-05-01: qty 0.6

## 2021-05-01 MED ORDER — 0.9 % SODIUM CHLORIDE (POUR BTL) OPTIME
TOPICAL | Status: DC | PRN
  Administered 2021-05-01 (×4): 1000 mL

## 2021-05-01 MED ORDER — SENNOSIDES-DOCUSATE SODIUM 8.6-50 MG PO TABS
1.0000 | ORAL_TABLET | Freq: Every evening | ORAL | Status: DC | PRN
  Administered 2021-05-01: 1 via ORAL
  Filled 2021-05-01: qty 1

## 2021-05-01 MED ORDER — ONDANSETRON HCL 4 MG/2ML IJ SOLN
INTRAMUSCULAR | Status: DC | PRN
  Administered 2021-05-01: 4 mg via INTRAVENOUS

## 2021-05-01 MED ORDER — LACTATED RINGERS IV SOLN: INTRAVENOUS | Status: DC

## 2021-05-01 MED ORDER — ZOLPIDEM TARTRATE 5 MG PO TABS: 5.0000 mg | ORAL_TABLET | Freq: Every evening | ORAL | Status: DC | PRN

## 2021-05-01 MED ORDER — PHENYLEPHRINE HCL (PRESSORS) 10 MG/ML IV SOLN
INTRAVENOUS | Status: AC
  Filled 2021-05-01: qty 1

## 2021-05-01 MED ORDER — POVIDONE-IODINE 10 % EX SWAB: 2.0000 "application " | Freq: Once | CUTANEOUS | Status: DC

## 2021-05-01 MED ORDER — ALUM & MAG HYDROXIDE-SIMETH 200-200-20 MG/5ML PO SUSP
30.0000 mL | ORAL | Status: DC | PRN
  Administered 2021-05-02: 30 mL via ORAL
  Filled 2021-05-01: qty 30

## 2021-05-01 MED ORDER — BUPIVACAINE-MELOXICAM ER 200-6 MG/7ML IJ SOLN
INTRAMUSCULAR | Status: DC | PRN
  Administered 2021-05-01: 200 mg

## 2021-05-01 MED ORDER — MIDAZOLAM HCL 2 MG/2ML IJ SOLN
INTRAMUSCULAR | Status: AC
  Filled 2021-05-01: qty 2

## 2021-05-01 MED ORDER — PHENYLEPHRINE 40 MCG/ML (10ML) SYRINGE FOR IV PUSH (FOR BLOOD PRESSURE SUPPORT)
PREFILLED_SYRINGE | INTRAVENOUS | Status: AC
  Filled 2021-05-01: qty 10

## 2021-05-01 MED ORDER — ROCURONIUM BROMIDE 10 MG/ML (PF) SYRINGE
PREFILLED_SYRINGE | INTRAVENOUS | Status: DC | PRN
  Administered 2021-05-01: 20 mg via INTRAVENOUS
  Administered 2021-05-01 (×2): 10 mg via INTRAVENOUS
  Administered 2021-05-01: 70 mg via INTRAVENOUS
  Administered 2021-05-01: 10 mg via INTRAVENOUS
  Administered 2021-05-01: 20 mg via INTRAVENOUS
  Administered 2021-05-01: 10 mg via INTRAVENOUS

## 2021-05-01 MED ORDER — BISACODYL 10 MG RE SUPP: 10.0000 mg | Freq: Every day | RECTAL | Status: DC | PRN

## 2021-05-01 MED ORDER — ONDANSETRON HCL 4 MG PO TABS: 8.0000 mg | ORAL_TABLET | Freq: Four times a day (QID) | ORAL | Status: DC | PRN

## 2021-05-01 MED ORDER — PHENYLEPHRINE HCL (PRESSORS) 10 MG/ML IV SOLN
INTRAVENOUS | Status: DC | PRN
  Administered 2021-05-01: 100 ug via INTRAVENOUS
  Administered 2021-05-01 (×2): 80 ug via INTRAVENOUS
  Administered 2021-05-01: 100 ug via INTRAVENOUS
  Administered 2021-05-01: 120 ug via INTRAVENOUS
  Administered 2021-05-01: 40 ug via INTRAVENOUS
  Administered 2021-05-01: 80 ug via INTRAVENOUS
  Administered 2021-05-01: 120 ug via INTRAVENOUS
  Administered 2021-05-01 (×2): 100 ug via INTRAVENOUS
  Administered 2021-05-01: 80 ug via INTRAVENOUS
  Administered 2021-05-01: 120 ug via INTRAVENOUS
  Administered 2021-05-01: 80 ug via INTRAVENOUS
  Administered 2021-05-01: 120 ug via INTRAVENOUS
  Administered 2021-05-01: 80 ug via INTRAVENOUS
  Administered 2021-05-01: 100 ug via INTRAVENOUS
  Administered 2021-05-01 (×3): 80 ug via INTRAVENOUS
  Administered 2021-05-01: 100 ug via INTRAVENOUS
  Administered 2021-05-01: 40 ug via INTRAVENOUS
  Administered 2021-05-01 (×2): 120 ug via INTRAVENOUS
  Administered 2021-05-01: 100 ug via INTRAVENOUS
  Administered 2021-05-01: 80 ug via INTRAVENOUS

## 2021-05-01 MED ORDER — PHENYLEPHRINE 40 MCG/ML (10ML) SYRINGE FOR IV PUSH (FOR BLOOD PRESSURE SUPPORT)
PREFILLED_SYRINGE | INTRAVENOUS | Status: AC
  Filled 2021-05-01: qty 20

## 2021-05-01 MED ORDER — DEXAMETHASONE SODIUM PHOSPHATE 10 MG/ML IJ SOLN
INTRAMUSCULAR | Status: AC
  Filled 2021-05-01: qty 1

## 2021-05-01 MED ORDER — CEFAZOLIN SODIUM-DEXTROSE 2-4 GM/100ML-% IV SOLN
2.0000 g | INTRAVENOUS | Status: AC
  Administered 2021-05-01 (×2): 2 g via INTRAVENOUS

## 2021-05-01 MED ORDER — PHENYLEPHRINE HCL-NACL 10-0.9 MG/250ML-% IV SOLN
INTRAVENOUS | Status: DC | PRN
  Administered 2021-05-01: 50 ug/min via INTRAVENOUS

## 2021-05-01 MED ORDER — DIPHENHYDRAMINE HCL 50 MG/ML IJ SOLN
25.0000 mg | Freq: Four times a day (QID) | INTRAMUSCULAR | Status: DC | PRN
  Administered 2021-05-01: 12.5 mg via INTRAVENOUS

## 2021-05-01 MED ORDER — ORAL CARE MOUTH RINSE: 15.0000 mL | Freq: Once | OROMUCOSAL | Status: AC

## 2021-05-01 MED ORDER — IBUPROFEN 600 MG PO TABS
600.0000 mg | ORAL_TABLET | Freq: Four times a day (QID) | ORAL | Status: DC
  Administered 2021-05-03 (×2): 600 mg via ORAL
  Filled 2021-05-01 (×5): qty 1

## 2021-05-01 MED ORDER — LIDOCAINE HCL (PF) 2 % IJ SOLN
INTRAMUSCULAR | Status: AC
  Filled 2021-05-01: qty 5

## 2021-05-01 MED ORDER — HEMOSTATIC AGENTS (NO CHARGE) OPTIME
TOPICAL | Status: DC | PRN
  Administered 2021-05-01 (×2): 1 via TOPICAL

## 2021-05-01 MED ORDER — FENTANYL CITRATE (PF) 100 MCG/2ML IJ SOLN
INTRAMUSCULAR | Status: AC
  Filled 2021-05-01: qty 2

## 2021-05-01 MED ORDER — ALBUMIN HUMAN 25 % IV SOLN: INTRAVENOUS | Status: DC | PRN

## 2021-05-01 MED ORDER — KETOROLAC TROMETHAMINE 30 MG/ML IJ SOLN
30.0000 mg | Freq: Four times a day (QID) | INTRAMUSCULAR | Status: AC
  Administered 2021-05-01 – 2021-05-02 (×3): 30 mg via INTRAVENOUS
  Filled 2021-05-01 (×3): qty 1

## 2021-05-01 MED ORDER — ONDANSETRON HCL 4 MG/2ML IJ SOLN
INTRAMUSCULAR | Status: AC
  Filled 2021-05-01: qty 2

## 2021-05-01 MED ORDER — SODIUM CHLORIDE 0.9 % IV SOLN
25.0000 mg | Freq: Four times a day (QID) | INTRAVENOUS | Status: DC | PRN
  Filled 2021-05-01: qty 1

## 2021-05-01 MED ORDER — BUPIVACAINE-MELOXICAM ER 200-6 MG/7ML IJ SOLN
200.0000 mg | Freq: Once | INTRAMUSCULAR | Status: DC
  Filled 2021-05-01: qty 1

## 2021-05-01 MED ORDER — PHENYLEPHRINE HCL-NACL 10-0.9 MG/250ML-% IV SOLN
INTRAVENOUS | Status: AC
  Filled 2021-05-01: qty 250

## 2021-05-01 MED ORDER — DEXAMETHASONE SODIUM PHOSPHATE 10 MG/ML IJ SOLN
INTRAMUSCULAR | Status: DC | PRN
  Administered 2021-05-01: 10 mg via INTRAVENOUS

## 2021-05-01 MED ORDER — PROPOFOL 500 MG/50ML IV EMUL
INTRAVENOUS | Status: DC | PRN
  Administered 2021-05-01: 25 ug/kg/min via INTRAVENOUS

## 2021-05-01 MED ORDER — ONDANSETRON HCL 4 MG/2ML IJ SOLN: 4.0000 mg | Freq: Once | INTRAMUSCULAR | Status: DC | PRN

## 2021-05-01 MED ORDER — OXYCODONE HCL 5 MG PO TABS: 5.0000 mg | ORAL_TABLET | ORAL | Status: DC | PRN

## 2021-05-01 MED ORDER — SUGAMMADEX SODIUM 500 MG/5ML IV SOLN
INTRAVENOUS | Status: DC | PRN
  Administered 2021-05-01: 250 mg via INTRAVENOUS

## 2021-05-01 MED ORDER — PROPOFOL 10 MG/ML IV BOLUS
INTRAVENOUS | Status: DC | PRN
  Administered 2021-05-01: 300 mg via INTRAVENOUS

## 2021-05-01 MED ORDER — LIDOCAINE HCL (CARDIAC) PF 100 MG/5ML IV SOSY
PREFILLED_SYRINGE | INTRAVENOUS | Status: DC | PRN
  Administered 2021-05-01: 100 mg via INTRATRACHEAL

## 2021-05-01 MED ORDER — FENTANYL CITRATE (PF) 100 MCG/2ML IJ SOLN
INTRAMUSCULAR | Status: DC | PRN
  Administered 2021-05-01 (×6): 50 ug via INTRAVENOUS
  Administered 2021-05-01: 100 ug via INTRAVENOUS
  Administered 2021-05-01 (×2): 50 ug via INTRAVENOUS

## 2021-05-01 SURGICAL SUPPLY — 48 items
APPLICATOR COTTON TIP 6 STRL (MISCELLANEOUS) ×2 IMPLANT
APPLICATOR COTTON TIP 6IN STRL (MISCELLANEOUS) ×4
APPLIER CLIP 13 LRG OPEN (CLIP) ×4
BAG HAMPER (MISCELLANEOUS) ×4 IMPLANT
BLADE SURG SZ10 CARB STEEL (BLADE) ×4 IMPLANT
CLIP APPLIE 13 LRG OPEN (CLIP) ×2 IMPLANT
CLOTH BEACON ORANGE TIMEOUT ST (SAFETY) ×4 IMPLANT
COVER LIGHT HANDLE STERIS (MISCELLANEOUS) ×8 IMPLANT
COVER WAND RF STERILE (DRAPES) ×4 IMPLANT
DERMABOND ADVANCED (GAUZE/BANDAGES/DRESSINGS) ×4
DERMABOND ADVANCED .7 DNX12 (GAUZE/BANDAGES/DRESSINGS) ×4 IMPLANT
DRAPE WARM FLUID 44X44 (DRAPES) ×4 IMPLANT
DRSG OPSITE POSTOP 4X10 (GAUZE/BANDAGES/DRESSINGS) ×4 IMPLANT
DRSG OPSITE POSTOP 4X8 (GAUZE/BANDAGES/DRESSINGS) ×4 IMPLANT
ELECT REM PT RETURN 9FT ADLT (ELECTROSURGICAL) ×4
ELECTRODE REM PT RTRN 9FT ADLT (ELECTROSURGICAL) ×2 IMPLANT
GAUZE 4X4 16PLY RFD (DISPOSABLE) ×4 IMPLANT
GLOVE ECLIPSE 8.0 STRL XLNG CF (GLOVE) ×4 IMPLANT
GLOVE SRG 8 PF TXTR STRL LF DI (GLOVE) ×2 IMPLANT
GLOVE SURG LTX SZ6.5 (GLOVE) ×8 IMPLANT
GLOVE SURG POLYISO LF SZ6.5 (GLOVE) ×4 IMPLANT
GLOVE SURG UNDER POLY LF SZ7 (GLOVE) ×28 IMPLANT
GLOVE SURG UNDER POLY LF SZ8 (GLOVE) ×2
GOWN STRL REUS W/TWL LRG LVL3 (GOWN DISPOSABLE) ×12 IMPLANT
GOWN STRL REUS W/TWL XL LVL3 (GOWN DISPOSABLE) ×4 IMPLANT
HEMOSTAT ARISTA ABSORB 3G PWDR (HEMOSTASIS) ×8 IMPLANT
INST SET MAJOR GENERAL (KITS) ×4 IMPLANT
KIT BLADEGUARD II DBL (SET/KITS/TRAYS/PACK) ×8 IMPLANT
KIT TURNOVER KIT A (KITS) ×4 IMPLANT
MANIFOLD NEPTUNE II (INSTRUMENTS) ×4 IMPLANT
NEEDLE HYPO 18GX1.5 BLUNT FILL (NEEDLE) ×4 IMPLANT
NEEDLE HYPO 21X1.5 SAFETY (NEEDLE) ×4 IMPLANT
NS IRRIG 1000ML POUR BTL (IV SOLUTION) ×16 IMPLANT
PACK MAJOR ABDOMINAL (CUSTOM PROCEDURE TRAY) ×4 IMPLANT
PAD ARMBOARD 7.5X6 YLW CONV (MISCELLANEOUS) ×4 IMPLANT
SET BASIN LINEN APH (SET/KITS/TRAYS/PACK) ×4 IMPLANT
SPONGE LAP 18X18 RF (DISPOSABLE) ×44 IMPLANT
SUT CHROMIC 0 CT 1 (SUTURE) ×8 IMPLANT
SUT MON AB 3-0 SH 27 (SUTURE) ×8 IMPLANT
SUT VIC AB 0 CT1 27 (SUTURE) ×10
SUT VIC AB 0 CT1 27XCR 8 STRN (SUTURE) ×10 IMPLANT
SUT VIC AB 0 CTX 36 (SUTURE) ×2
SUT VIC AB 0 CTX36XBRD ANTBCTR (SUTURE) ×2 IMPLANT
SUT VICRYL 3 0 (SUTURE) ×4 IMPLANT
SYR 20ML LL LF (SYRINGE) ×8 IMPLANT
TOWEL OR 17X26 4PK STRL BLUE (TOWEL DISPOSABLE) ×4 IMPLANT
TOWEL SURG RFD BLUE STRL DISP (DISPOSABLE) ×4 IMPLANT
TRAY FOLEY MTR SLVR 16FR STAT (SET/KITS/TRAYS/PACK) ×4 IMPLANT

## 2021-05-01 NOTE — Progress Notes (Addendum)
Pt's itching was relieved with the dose of 12.5 mg of Benadryl given at 1452

## 2021-05-01 NOTE — Progress Notes (Signed)
Received call from lab that patient's hemoglobin was down to 7.4. Notified MD and lab is redrawing blood

## 2021-05-01 NOTE — Transfer of Care (Signed)
Immediate Anesthesia Transfer of Care Note  Patient: Penny Garza  Procedure(s) Performed: HYSTERECTOMY SUPRACERVICAL ABDOMINAL (Abdomen) OPEN BILATERAL SALPINGECTOMY and OOPHORECTOMY (Bilateral: Abdomen)  Patient Location: PACU  Anesthesia Type:General  Level of Consciousness: awake  Airway & Oxygen Therapy: Patient Spontanous Breathing and Patient connected to nasal cannula oxygen  Post-op Assessment: Report given to RN and Post -op Vital signs reviewed and stable  Post vital signs: Reviewed and stable  Last Vitals:  Vitals Value Taken Time  BP 112/78 05/01/21 1404  Temp    Pulse 70 05/01/21 1407  Resp 19 05/01/21 1407  SpO2 98 % 05/01/21 1407  Vitals shown include unvalidated device data.  Last Pain:  Vitals:   05/01/21 0807  TempSrc: Oral  PainSc: 0-No pain         Complications: No notable events documented.

## 2021-05-01 NOTE — Anesthesia Procedure Notes (Signed)
Procedure Name: Intubation Date/Time: 05/01/2021 9:29 AM Performed by: Karna Dupes, CRNA Pre-anesthesia Checklist: Patient identified, Emergency Drugs available, Suction available and Patient being monitored Patient Re-evaluated:Patient Re-evaluated prior to induction Oxygen Delivery Method: Circle system utilized Preoxygenation: Pre-oxygenation with 100% oxygen Induction Type: IV induction Ventilation: Mask ventilation without difficulty Laryngoscope Size: Mac and 4 Grade View: Grade I Tube type: Oral Tube size: 7.0 mm Number of attempts: 1 Airway Equipment and Method: Stylet and Oral airway Placement Confirmation: ETT inserted through vocal cords under direct vision, positive ETCO2 and breath sounds checked- equal and bilateral Secured at: 22 cm Tube secured with: Tape Dental Injury: Teeth and Oropharynx as per pre-operative assessment

## 2021-05-01 NOTE — Op Note (Signed)
Preoperative diagnosis:  1.  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  Pelvic abdominal pain                                         3.  Menometrorrhagia managed with progesterone only methods                                                                           4.  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, significant adhesions to the cecum sigmoid colon and left pelvic sidewall  Procedure:  Abdominal hysterectomy, supracervical with bilateral salpingo-oophorectomy, extensive lysis of adhesions  Surgeon:  Florian Buff  Assistant:    Anesthesia:  General endotracheal  Preoperative clinical summary: Patient has a long known history of fibroid uterus and back to her days in the [employer] For 63 years or so she has been told she had large fibroids never had consistency of care which allowed a hysterectomy to be performed For the past year or so she has developed symptomatic anemia and has had iron transfusions of menometrorrhagia Sonogram her uterus is 5000 g Nominal exam her uterus extends up to her liver the right and epigastric on the left  Intraoperative findings: Of course the patient had extremely large fibroid uterus with the sonographic measurements and the physical exam What was unexpected was the extensive adhesions the fibroids the surrounding tissue The fibroids were parasitic significant vascular and peritoneal traction and adhesions with the cecum, sigmoid colon and descending colon, left pelvic sidewall retroperitoneal space The fibroids had essentially recruited blood supply from the peritoneum of these areas causing both vascular movement and an almost cocoon end of peritoneal adhesions There were bilateral broad ligament fibroids extending to the pelvic sidewall bilaterally The left broad ligament fibroid is  in the left ureter The bladder was not impacted  Description of operation:  Patient was taken to the operating room and placed in the supine position where she underwent general endotracheal anesthesia.  She was then prepped and draped in the usual sterile fashion and a Foley catheter was placed for continuous bladder drainage.  A high transverse  skin incision at the level of the anterior superior ischial spines was made and carried down sharply to the rectus fascia which was scored in the midline and extended laterally.  The fascia was taken off the muscles superiorly and inferiorly without difficulty.  The muscles were divided.  The peritoneal cavity was entered.  A large Alexis self-retaining retractor was placed.  The upper abdomen could not be packed away at this time due to significant adhesions Deal of time was taken and the dissection of the cecum and the right adnexa and a parasitic fibroid in the right cornu. The right tube and ovary were also removed during this dissection  and vascular management. All pedicles were crossclamped cut and double 4 and aft sutures placed. At this point I spent a great deal of time trying to deliver the uterus through the abdominal incision was unsuccessful because of the adhesions of the fibroid to the sigmoid and descending colon I performed a wedge myomectomy of an anterior fibroid and was able to decompress the uterus significantly at this point I then had enough visualization to dissect the descending and sigmoid colon off of the posterior uterus A great deal of vascular involvement with this tissue just like the right upper quadrant this whole back surface of the uterus being parasitic to the peritoneum recruiting vascular supply Once I had the sigmoid and descending colon freed up from the posterior uterine wall I was able to relate the uterine vessels on the right There was a significant right broad ligament fibroid I dissected out noting the location of  the right ureter throughout I dissected the fibroid free and mobilized it cephalad I then clamped the uterine vessels the right just below this fibroid The pedicle was clamped cut and transfixion suture ligated  I then turned attention to the left side of the uterus At this point I was able to rotate the uterus and mobilized the left tube and ovary The infundibulopelvic ligament was clamped cut and double suture-ligated 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 And I dissected this fibroid out carefully mobilized cephalad and clamped the left uterine vessels below this fibroid The cord was clamped cut and transfixion suture ligated Because of the significant dissection in the left retroperitoneal space great deal of time was spent getting hemostasis with hemoclips I dissected the ureter out along its entire length from the pelvic brim and it continued to have peristalsis throughout the procedure I did have to dissect essentially right on top of the left ureter again it was functioning normally it appeared I then took 2 more pedicles below and vessels on both sides Each pedicle was clamped cut and transfixion suture ligated with good hemostasis I then amputated the uterus and proximal cervix at this point per the preoperative plan Therefore I removed both tubes and ovaries the uterus and proximal cervix Because of the parasitic nature of the fibroids I took a great deal of time ensuring hemostasis of all the retroperitoneal areas impacted by the fibroids as well as the dissection off of the cecum and sigmoid colon And the ureter was undergoing normal peristalsis at this point throughout its length The cervix was closed with interrupted figure-of-eight sutures with good hemostasis Arista was used for additional hemostasis and the peritoneal surfaces All pedicles were hemostatic  Blood loss for the procedure was estimated at 1100 cc   All specimens were  sent to pathology for routine evaluation.  The Alexis self-retaining retractor was removed and the pelvis was irrigated vigorously.  All packs were removed and all counts were correct at this point x 3.  The muscles and peritoneum were reapproximated loosely.  The fascia was closed with 0 Vicryl running.  The subcutaneous tissue was reapproximated using 2-0 plain gut.  The skin was closed using 3-0 Vicryl on a Keith needle in a subcuticular fashion.  Dermabond was then applied for additional wound integrity and to serve as a postoperative bacterial barrier.  The patient was awakened from anesthesia taken to the recovery room in good stable condition. All sponge instrument and needle counts were correct x 3.  The  patient received Ancef and Toradol prophylactically preoperatively.    The surgery took about 3 hours   Florian Buff, MD  05/01/2021 2:11 PM

## 2021-05-01 NOTE — OR Nursing (Signed)
Nuc Med personal in to inject patient.

## 2021-05-01 NOTE — Anesthesia Preprocedure Evaluation (Signed)
Anesthesia Evaluation  Patient identified by MRN, date of birth, ID band Patient awake    Reviewed: Allergy & Precautions, H&P , NPO status , Patient's Chart, lab work & pertinent test results, reviewed documented beta blocker date and time   Airway Mallampati: II  TM Distance: >3 FB Neck ROM: full    Dental no notable dental hx.    Pulmonary neg pulmonary ROS,    Pulmonary exam normal breath sounds clear to auscultation       Cardiovascular Exercise Tolerance: Good negative cardio ROS   Rhythm:regular Rate:Normal     Neuro/Psych PSYCHIATRIC DISORDERS Depression negative neurological ROS     GI/Hepatic negative GI ROS, Neg liver ROS,   Endo/Other  Morbid obesity  Renal/GU negative Renal ROS  negative genitourinary   Musculoskeletal   Abdominal   Peds  Hematology  (+) Blood dyscrasia, anemia ,   Anesthesia Other Findings   Reproductive/Obstetrics negative OB ROS                             Anesthesia Physical Anesthesia Plan  ASA: 3  Anesthesia Plan: General and General ETT   Post-op Pain Management:    Induction:   PONV Risk Score and Plan: Ondansetron  Airway Management Planned:   Additional Equipment:   Intra-op Plan:   Post-operative Plan:   Informed Consent: I have reviewed the patients History and Physical, chart, labs and discussed the procedure including the risks, benefits and alternatives for the proposed anesthesia with the patient or authorized representative who has indicated his/her understanding and acceptance.     Dental Advisory Given  Plan Discussed with: CRNA  Anesthesia Plan Comments:         Anesthesia Quick Evaluation

## 2021-05-01 NOTE — H&P (Signed)
Preoperative History and Physical  Penny Garza is a 50 y.o. G0P0000 with No LMP recorded. admitted for a abdominal supracervical hysterectomy with removal of both tubes and ovaries.  Uterus is 5000 grams fibroids multiple no suspicion beyond the obvious for leiomyosarcoma. Hemoglobin is improved with iron infusions and progesterone only management  PMH:    Past Medical History:  Diagnosis Date   Anemia    Complication of anesthesia    Patient woke up during procedure    PSH:     Past Surgical History:  Procedure Laterality Date   COLONOSCOPY     ENDOMETRIAL BIOPSY     IUD REMOVAL      POb/GynH:      OB History     Gravida  0   Para  0   Term  0   Preterm  0   AB  0   Living  0      SAB  0   IAB  0   Ectopic  0   Multiple  0   Live Births  0           SH:   Social History   Tobacco Use   Smoking status: Never   Smokeless tobacco: Never  Vaping Use   Vaping Use: Never used  Substance Use Topics   Alcohol use: Yes   Drug use: Never    FH:    Family History  Problem Relation Age of Onset   Diabetes Paternal Grandfather    Diabetes Paternal Grandmother    Diabetes Maternal Grandfather    Diabetes Father    Hypertension Father    Heart attack Mother      Allergies:  Allergies  Allergen Reactions   Codeine Anaphylaxis   Iron Sucrose Rash   Azithromycin    Milk-Related Compounds Other (See Comments)    Stomach pain   Other Dermatitis    Certain metals     Medications:       Current Facility-Administered Medications:    bupivacaine-meloxicam ER (ZYNRELEF) injection 200 mg, 200 mg, Infiltration, Once, Yosef Krogh, Mertie Clause, MD   ceFAZolin (ANCEF) 2-4 GM/100ML-% IVPB, , , ,    ceFAZolin (ANCEF) IVPB 2g/100 mL premix, 2 g, Intravenous, On Call to OR, Florian Buff, MD   ketorolac (TORADOL) 30 MG/ML injection, , , ,    lactated ringers infusion, , Intravenous, Continuous, Kiel, Coralie Keens, MD, Last Rate: 10 mL/hr at 05/01/21 0841, New Bag  at 05/01/21 0841   povidone-iodine 10 % swab 2 application, 2 application, Topical, Once, Florian Buff, MD  Review of Systems:   Review of Systems  Constitutional: Negative for fever, chills, weight loss, malaise/fatigue and diaphoresis.  HENT: Negative for hearing loss, ear pain, nosebleeds, congestion, sore throat, neck pain, tinnitus and ear discharge.   Eyes: Negative for blurred vision, double vision, photophobia, pain, discharge and redness.  Respiratory: Negative for cough, hemoptysis, sputum production, shortness of breath, wheezing and stridor.   Cardiovascular: Negative for chest pain, palpitations, orthopnea, claudication, leg swelling and PND.  Gastrointestinal: Positive for abdominal pain. Negative for heartburn, nausea, vomiting, diarrhea, constipation, blood in stool and melena.  Genitourinary: Negative for dysuria, urgency, frequency, hematuria and flank pain.  Musculoskeletal: Negative for myalgias, back pain, joint pain and falls.  Skin: Negative for itching and rash.  Neurological: Negative for dizziness, tingling, tremors, sensory change, speech change, focal weakness, seizures, loss of consciousness, weakness and headaches.  Endo/Heme/Allergies: Negative for environmental allergies and polydipsia. Does not  bruise/bleed easily.  Psychiatric/Behavioral: Negative for depression, suicidal ideas, hallucinations, memory loss and substance abuse. The patient is not nervous/anxious and does not have insomnia.      PHYSICAL EXAM:  Blood pressure (!) 151/109, pulse 81, temperature 98.2 F (36.8 C), temperature source Oral, resp. rate 17, height 5\' 7"  (1.702 m), weight 120.2 kg, SpO2 100 %.    Vitals reviewed. Constitutional: She is oriented to person, place, and time. She appears well-developed and well-nourished.  HENT:  Head: Normocephalic and atraumatic.  Right Ear: External ear normal.  Left Ear: External ear normal.  Nose: Nose normal.  Mouth/Throat: Oropharynx is  clear and moist.  Eyes: Conjunctivae and EOM are normal. Pupils are equal, round, and reactive to light. Right eye exhibits no discharge. Left eye exhibits no discharge. No scleral icterus.  Neck: Normal range of motion. Neck supple. No tracheal deviation present. No thyromegaly present.  Cardiovascular: Normal rate, regular rhythm, normal heart sounds and intact distal pulses.  Exam reveals no gallop and no friction rub.   No murmur heard. Respiratory: Effort normal and breath sounds normal. No respiratory distress. She has no wheezes. She has no rales. She exhibits no tenderness.  GI: Soft. Bowel sounds are normal. She exhibits no distension and no mass. There is tenderness. There is no rebound and no guarding.  Genitourinary:       Vulva is normal without lesions Vagina is pink moist without discharge Cervix normal in appearance and pap is normal Uterus is up to the liver on exam Adnexa is negative with normal sized ovaries by sonogram  Musculoskeletal: Normal range of motion. She exhibits no edema and no tenderness.  Neurological: She is alert and oriented to person, place, and time. She has normal reflexes. She displays normal reflexes. No cranial nerve deficit. She exhibits normal muscle tone. Coordination normal.  Skin: Skin is warm and dry. No rash noted. No erythema. No pallor.  Psychiatric: She has a normal mood and affect. Her behavior is normal. Judgment and thought content normal.    Labs: Results for orders placed or performed during the hospital encounter of 04/30/21 (from the past 336 hour(s))  SARS CORONAVIRUS 2 (TAT 6-24 HRS) Nasopharyngeal Nasopharyngeal Swab   Collection Time: 04/30/21 10:41 AM   Specimen: Nasopharyngeal Swab  Result Value Ref Range   SARS Coronavirus 2 NEGATIVE NEGATIVE  CBC   Collection Time: 04/30/21 11:47 AM  Result Value Ref Range   WBC 6.6 4.0 - 10.5 K/uL   RBC 3.67 (L) 3.87 - 5.11 MIL/uL   Hemoglobin 10.9 (L) 12.0 - 15.0 g/dL   HCT 35.3 (L)  36.0 - 46.0 %   MCV 96.2 80.0 - 100.0 fL   MCH 29.7 26.0 - 34.0 pg   MCHC 30.9 30.0 - 36.0 g/dL   RDW 16.2 (H) 11.5 - 15.5 %   Platelets 341 150 - 400 K/uL   nRBC 0.0 0.0 - 0.2 %  Comprehensive metabolic panel   Collection Time: 04/30/21 11:47 AM  Result Value Ref Range   Sodium 137 135 - 145 mmol/L   Potassium 3.7 3.5 - 5.1 mmol/L   Chloride 106 98 - 111 mmol/L   CO2 23 22 - 32 mmol/L   Glucose, Bld 96 70 - 99 mg/dL   BUN 10 6 - 20 mg/dL   Creatinine, Ser 0.55 0.44 - 1.00 mg/dL   Calcium 8.7 (L) 8.9 - 10.3 mg/dL   Total Protein 7.3 6.5 - 8.1 g/dL   Albumin 3.6 3.5 - 5.0  g/dL   AST 18 15 - 41 U/L   ALT 15 0 - 44 U/L   Alkaline Phosphatase 77 38 - 126 U/L   Total Bilirubin 0.5 0.3 - 1.2 mg/dL   GFR, Estimated >60 >60 mL/min   Anion gap 8 5 - 15  hCG, quantitative, pregnancy   Collection Time: 04/30/21 11:48 AM  Result Value Ref Range   hCG, Beta Chain, Quant, S <1 <5 mIU/mL  Rapid HIV screen (HIV 1/2 Ab+Ag)   Collection Time: 04/30/21 11:48 AM  Result Value Ref Range   HIV-1 P24 Antigen - HIV24 NON REACTIVE NON REACTIVE   HIV 1/2 Antibodies NON REACTIVE NON REACTIVE   Interpretation (HIV Ag Ab)      A non reactive test result means that HIV 1 or HIV 2 antibodies and HIV 1 p24 antigen were not detected in the specimen.  Urinalysis, Routine w reflex microscopic Urine, Clean Catch   Collection Time: 04/30/21 11:48 AM  Result Value Ref Range   Color, Urine YELLOW YELLOW   APPearance CLEAR CLEAR   Specific Gravity, Urine 1.017 1.005 - 1.030   pH 6.0 5.0 - 8.0   Glucose, UA NEGATIVE NEGATIVE mg/dL   Hgb urine dipstick SMALL (A) NEGATIVE   Bilirubin Urine NEGATIVE NEGATIVE   Ketones, ur NEGATIVE NEGATIVE mg/dL   Protein, ur 100 (A) NEGATIVE mg/dL   Nitrite NEGATIVE NEGATIVE   Leukocytes,Ua NEGATIVE NEGATIVE   RBC / HPF 0-5 0 - 5 RBC/hpf   WBC, UA 0-5 0 - 5 WBC/hpf   Bacteria, UA RARE (A) NONE SEEN   Squamous Epithelial / LPF 0-5 0 - 5   Mucus PRESENT    Hyaline Casts,  UA PRESENT   Type and screen   Collection Time: 04/30/21 11:48 AM  Result Value Ref Range   ABO/RH(D) O POS    Antibody Screen NEG    Sample Expiration      05/03/2021,2359 Performed at [facility], [address], [place], [place] [address]     EKG: Orders placed or performed in visit on 04/30/21   EKG 12-Lead   EKG 12-Lead   EKG 12-Lead    Imaging Studies: No results found.    Assessment: 5000 gram fibroid uterus Menometrorrhagia Iron deficiency anemia S/P iron infusion  Plan: Abdominal supracervical hysterectomy with removal of both tubes and ovaries  Pt understands the risks of surgery including but not limited t  excessive bleeding requiring transfusion or reoperation, post-operative infection requiring prolonged hospitalization or re-hospitalization and antibiotic therapy, and damage to other organs including bladder, bowel, ureters and major vessels.  The patient also understands the alternative treatment options which were discussed in full.  All questions were answered.  Florian Buff 05/01/2021 8:54 AM   Florian Buff 05/01/2021 8:53 AM

## 2021-05-01 NOTE — Anesthesia Postprocedure Evaluation (Signed)
Anesthesia Post Note  Patient: Penny Garza  Procedure(s) Performed: HYSTERECTOMY SUPRACERVICAL ABDOMINAL (Abdomen) OPEN BILATERAL SALPINGECTOMY and OOPHORECTOMY (Bilateral: Abdomen)  Patient location during evaluation: Phase II Anesthesia Type: General Level of consciousness: awake Pain management: pain level controlled Vital Signs Assessment: post-procedure vital signs reviewed and stable Respiratory status: spontaneous breathing and respiratory function stable Cardiovascular status: blood pressure returned to baseline and stable Postop Assessment: no headache and no apparent nausea or vomiting Anesthetic complications: no Comments: Late entry   No notable events documented.   Last Vitals:  Vitals:   05/01/21 1515 05/01/21 1530  BP: 116/70 110/78  Pulse: 74 76  Resp: 18 19  Temp:    SpO2: 100% 100%    Last Pain:  Vitals:   05/01/21 1515  TempSrc:   PainSc: [place]

## 2021-05-02 ENCOUNTER — Encounter (HOSPITAL_COMMUNITY): Payer: Self-pay | Admitting: Obstetrics & Gynecology

## 2021-05-02 ENCOUNTER — Encounter: Payer: Non-veteran care | Admitting: Obstetrics & Gynecology

## 2021-05-02 LAB — CBC WITH DIFFERENTIAL/PLATELET
Abs Immature Granulocytes: 0.06 10*3/uL (ref 0.00–0.07)
Basophils Absolute: 0 10*3/uL (ref 0.0–0.1)
Basophils Relative: 0 %
Eosinophils Absolute: 0 10*3/uL (ref 0.0–0.5)
Eosinophils Relative: 0 %
HCT: 21.4 % — ABNORMAL LOW (ref 36.0–46.0)
Hemoglobin: 6.6 g/dL — CL (ref 12.0–15.0)
Immature Granulocytes: 1 %
Lymphocytes Relative: 11 %
Lymphs Abs: 1.5 10*3/uL (ref 0.7–4.0)
MCH: 30.3 pg (ref 26.0–34.0)
MCHC: 30.8 g/dL (ref 30.0–36.0)
MCV: 98.2 fL (ref 80.0–100.0)
Monocytes Absolute: 0.9 10*3/uL (ref 0.1–1.0)
Monocytes Relative: 7 %
Neutro Abs: 10.5 10*3/uL — ABNORMAL HIGH (ref 1.7–7.7)
Neutrophils Relative %: 81 %
Platelets: 308 10*3/uL (ref 150–400)
RBC: 2.18 MIL/uL — ABNORMAL LOW (ref 3.87–5.11)
RDW: 16.8 % — ABNORMAL HIGH (ref 11.5–15.5)
WBC: 12.9 10*3/uL — ABNORMAL HIGH (ref 4.0–10.5)
nRBC: 0 % (ref 0.0–0.2)

## 2021-05-02 LAB — BASIC METABOLIC PANEL
Anion gap: 6 (ref 5–15)
BUN: 21 mg/dL — ABNORMAL HIGH (ref 6–20)
CO2: 25 mmol/L (ref 22–32)
Calcium: 7.8 mg/dL — ABNORMAL LOW (ref 8.9–10.3)
Chloride: 106 mmol/L (ref 98–111)
Creatinine, Ser: 1.27 mg/dL — ABNORMAL HIGH (ref 0.44–1.00)
GFR, Estimated: 52 mL/min — ABNORMAL LOW (ref >60)
Glucose, Bld: 139 mg/dL — ABNORMAL HIGH (ref 70–99)
Potassium: 4.3 mmol/L (ref 3.5–5.1)
Sodium: 137 mmol/L (ref 135–145)

## 2021-05-02 LAB — PREPARE RBC (CROSSMATCH)

## 2021-05-02 LAB — HEMOGLOBIN AND HEMATOCRIT, BLOOD
HCT: 24.6 % — ABNORMAL LOW (ref 36.0–46.0)
Hemoglobin: 7.7 g/dL — ABNORMAL LOW (ref 12.0–15.0)

## 2021-05-02 MED ORDER — ENOXAPARIN SODIUM 60 MG/0.6ML IJ SOSY
60.0000 mg | PREFILLED_SYRINGE | INTRAMUSCULAR | Status: DC
  Administered 2021-05-03: 60 mg via SUBCUTANEOUS
  Filled 2021-05-02: qty 0.6

## 2021-05-02 MED ORDER — FUROSEMIDE 10 MG/ML IJ SOLN
40.0000 mg | Freq: Once | INTRAMUSCULAR | Status: DC
  Filled 2021-05-02: qty 4

## 2021-05-02 MED ORDER — DIPHENHYDRAMINE HCL 25 MG PO CAPS
25.0000 mg | ORAL_CAPSULE | Freq: Once | ORAL | Status: AC
  Administered 2021-05-02: 25 mg via ORAL
  Filled 2021-05-02: qty 1

## 2021-05-02 MED ORDER — SODIUM CHLORIDE 0.9% IV SOLUTION
Freq: Once | INTRAVENOUS | Status: AC
Start: 2021-05-02 — End: 2021-05-02

## 2021-05-02 MED ORDER — ACETAMINOPHEN 325 MG PO TABS
650.0000 mg | ORAL_TABLET | Freq: Once | ORAL | Status: AC
  Administered 2021-05-02: 650 mg via ORAL
  Filled 2021-05-02: qty 2

## 2021-05-02 NOTE — Progress Notes (Signed)
Date and time results received: 05/02/21 0720 (use smartphrase ".now" to insert current time)  Test: HGB Critical Value: 6.6  Name of Provider Notified: Florian Buff, MD  Orders Received? Or Actions Taken?: Awaiting orders.

## 2021-05-02 NOTE — Progress Notes (Signed)
Blood transfusion started at 0915, but within 4 minutes pt began to complain of severe pain at IV site. IV flushes, no s/s infiltration but pt states site very painful. Blood transfusion stopped, IV site removed and new site started in left inner wrist, and blood restarted at 0932.

## 2021-05-02 NOTE — Progress Notes (Signed)
   05/02/21 1423  Assess: MEWS Score  BP (!) 80/42  Assess: MEWS Score  MEWS Temp 0  MEWS Systolic 2  MEWS Pulse 0  MEWS RR 0  MEWS LOC 0  MEWS Score 2  MEWS Score Color Yellow  Assess: if the MEWS score is Yellow or Red  Were vital signs taken at a resting state? Yes  Focused Assessment Change from prior assessment (see assessment flowsheet)  Early Detection of Sepsis Score *See Row Information* Low  MEWS guidelines implemented *See Row Information* Yes  Treat  MEWS Interventions Other (Comment) (provider notified)  Take Vital Signs  Increase Vital Sign Frequency  Yellow: Q 2hr X 2 then Q 4hr X 2, if remains yellow, continue Q 4hrs  Escalate  MEWS: Escalate Yellow: discuss with charge nurse/RN and consider discussing with provider and RRT  Notify: Charge Nurse/RN  Name of Charge Nurse/RN Notified Mable Fill, RN  Date Charge Nurse/RN Notified 05/02/21  Time Charge Nurse/RN Notified [id number]  Notify: Provider  Provider Name/Title [name]  Date Provider Notified 05/02/21  Time Provider Notified [id number]  Notification Type Page  Notification Reason Change in status (low BP)  Date of Provider Response 05/02/21  Time of Provider Response [id number]

## 2021-05-02 NOTE — Progress Notes (Signed)
Patient had 58F catheter in place. Balloon was deflated and catheter removed this morning. Patient tolerated this well. Walked to bathroom and urinated immediately after removal. Left patient sitting up in recliner with call bell within reach. Chair in locked position.

## 2021-05-02 NOTE — Progress Notes (Signed)
[place] notification complete. [phone]

## 2021-05-03 LAB — BASIC METABOLIC PANEL
Anion gap: 4 — ABNORMAL LOW (ref 5–15)
BUN: 19 mg/dL (ref 6–20)
CO2: 28 mmol/L (ref 22–32)
Calcium: 8.3 mg/dL — ABNORMAL LOW (ref 8.9–10.3)
Chloride: 108 mmol/L (ref 98–111)
Creatinine, Ser: 0.7 mg/dL (ref 0.44–1.00)
GFR, Estimated: >60 mL/min (ref >60)
Glucose, Bld: 101 mg/dL — ABNORMAL HIGH (ref 70–99)
Potassium: 4.1 mmol/L (ref 3.5–5.1)
Sodium: 140 mmol/L (ref 135–145)

## 2021-05-03 LAB — TYPE AND SCREEN
ABO/RH(D): O POS
Antibody Screen: NEGATIVE
Unit division: 0
Unit division: 0

## 2021-05-03 LAB — CBC WITH DIFFERENTIAL/PLATELET
Abs Immature Granulocytes: 0.04 10*3/uL (ref 0.00–0.07)
Basophils Absolute: 0 10*3/uL (ref 0.0–0.1)
Basophils Relative: 0 %
Eosinophils Absolute: 0 10*3/uL (ref 0.0–0.5)
Eosinophils Relative: 1 %
HCT: 25.2 % — ABNORMAL LOW (ref 36.0–46.0)
Hemoglobin: 7.8 g/dL — ABNORMAL LOW (ref 12.0–15.0)
Immature Granulocytes: 1 %
Lymphocytes Relative: 22 %
Lymphs Abs: 2 10*3/uL (ref 0.7–4.0)
MCH: 29.9 pg (ref 26.0–34.0)
MCHC: 31 g/dL (ref 30.0–36.0)
MCV: 96.6 fL (ref 80.0–100.0)
Monocytes Absolute: 0.6 10*3/uL (ref 0.1–1.0)
Monocytes Relative: 7 %
Neutro Abs: 6.1 10*3/uL (ref 1.7–7.7)
Neutrophils Relative %: 69 %
Platelets: 247 10*3/uL (ref 150–400)
RBC: 2.61 MIL/uL — ABNORMAL LOW (ref 3.87–5.11)
RDW: 16.7 % — ABNORMAL HIGH (ref 11.5–15.5)
WBC: 8.8 10*3/uL (ref 4.0–10.5)
nRBC: 0 % (ref 0.0–0.2)

## 2021-05-03 LAB — BPAM RBC
Blood Product Expiration Date: 202207182359
Blood Product Expiration Date: 202207202359
ISSUE DATE / TIME: 202206160853
ISSUE DATE / TIME: 202206161117
Unit Type and Rh: 5100
Unit Type and Rh: 5100

## 2021-05-03 LAB — SURGICAL PATHOLOGY

## 2021-05-03 MED ORDER — OXYCODONE HCL 5 MG PO TABS: 5.0000 mg | ORAL_TABLET | ORAL | 0 refills | Status: DC | PRN

## 2021-05-03 MED ORDER — ONDANSETRON HCL 8 MG PO TABS: 8.0000 mg | ORAL_TABLET | Freq: Four times a day (QID) | ORAL | 0 refills | Status: DC | PRN

## 2021-05-03 MED ORDER — IBUPROFEN 600 MG PO TABS: 600.0000 mg | ORAL_TABLET | Freq: Four times a day (QID) | ORAL | 0 refills | Status: DC

## 2021-05-03 NOTE — Discharge Summary (Signed)
Physician Discharge Summary  Patient ID: Penny Garza MRN: [id number] DOB/AGE: Dec 15, 1970 50 y.o.  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 Blood loss for the procedure was 1100 cc Postoperatively the patient's hemoglobin dropped to 6.6 which was not unexpected I transfused 2 units of packed red blood cells and her hemoglobin was stable at 7.8 at the time of discharge Additionally her creatinine which preoperatively was 0.7 increased to 1.27 from the anemia but was back down to 2.7 before the time of discharge At the time of discharge the patient was voiding without difficulty tolerating liquids and a regular diet passing gas not yet had a bowel movement She was having minimal pain and not requiring narcotics Abdominal exam and incision were normal postoperatively  Consults: None  Significant Diagnostic Studies: labs:       Treatments: Transfusion of 2 units packed red blood cells postoperatively  Discharge Exam: Blood pressure 128/74, pulse 76, temperature 98.1 F (36.7 C), temperature source Oral, resp. rate 20, height 5\' 7"  (1.702 m), weight 120.2 kg, SpO2 99 %. General appearance: alert, cooperative, and no distress GI: soft, non-tender; bowel sounds normal; no masses,  no organomegaly Incision/Wound:clean dry intact  Disposition:  Discharge disposition: 01-Home or Self Care       Discharge Instructions     Call MD for:  persistant nausea and vomiting   Complete by: As directed    Call MD for:  severe uncontrolled pain   Complete by: As directed    Call MD for:  temperature >100.4   Complete by: As directed    Diet - low sodium heart healthy   Complete by: As directed    Driving Restrictions   Complete by: As directed    Do not drive for 7 more days   Increase activity slowly   Complete by: As directed    Leave dressing on - Keep it clean, dry, and intact until clinic visit   Complete by: As directed    Lifting restrictions   Complete by: As directed    Do not lift more than 10 pounds   Sexual Activity Restrictions   Complete by: As directed    No sex until given OK      Allergies as of 05/03/2021       Reactions   Codeine Anaphylaxis   Iron Sucrose Rash   Azithromycin    Milk-related Compounds Other (See Comments)   Stomach pain   Other Dermatitis   Certain metals         Medication List     TAKE these medications    ferrous sulfate 325 (65 FE) MG tablet Take 325 mg by mouth daily with breakfast.   ibuprofen 600 MG tablet Commonly known as: ADVIL Take 1 tablet (600 mg total) by mouth every 6 (six) hours.   ondansetron 8 MG tablet Commonly known as: ZOFRAN Take 1 tablet (8 mg total) by mouth every 6 (six) hours as needed for nausea.  oxyCODONE 5 MG immediate release tablet Commonly known as: Oxy IR/ROXICODONE Take 1-2 tablets (5-10 mg total) by mouth every 4 (four) hours as needed for moderate pain.               Discharge Care Instructions  (From admission, onward)           Start     Ordered   05/03/21 0000  Leave dressing on - Keep it clean, dry, and intact until clinic visit        05/03/21 1324            Follow-up Information     Florian Buff, MD Follow up on 05/13/2021.   Specialties: Obstetrics and Gynecology, Radiology Why: post op  visit Contact information: [place] [id number] [phone]                 Signed: Florian Buff 05/03/2021, 1:28 PM

## 2021-05-03 NOTE — Plan of Care (Signed)
  Problem: Education: Goal: Knowledge of General Education information will improve Description: Including pain rating scale, medication(s)/side effects and non-pharmacologic comfort measures 05/03/2021 1351 by Santa Lighter, RN Outcome: Adequate for Discharge 05/03/2021 1038 by Santa Lighter, RN Outcome: Progressing   Problem: Health Behavior/Discharge Planning: Goal: Ability to manage health-related needs will improve 05/03/2021 1351 by Santa Lighter, RN Outcome: Adequate for Discharge 05/03/2021 1038 by Santa Lighter, RN Outcome: Progressing   Problem: Clinical Measurements: Goal: Ability to maintain clinical measurements within normal limits will improve Outcome: Adequate for Discharge Goal: Will remain free from infection Outcome: Adequate for Discharge Goal: Diagnostic test results will improve Outcome: Adequate for Discharge Goal: Respiratory complications will improve Outcome: Adequate for Discharge Goal: Cardiovascular complication will be avoided Outcome: Adequate for Discharge   Problem: Activity: Goal: Risk for activity intolerance will decrease 05/03/2021 1351 by Santa Lighter, RN Outcome: Adequate for Discharge 05/03/2021 1038 by Santa Lighter, RN Outcome: Progressing   Problem: Nutrition: Goal: Adequate nutrition will be maintained Outcome: Adequate for Discharge   Problem: Coping: Goal: Level of anxiety will decrease Outcome: Adequate for Discharge   Problem: Elimination: Goal: Will not experience complications related to bowel motility 05/03/2021 1351 by Santa Lighter, RN Outcome: Adequate for Discharge 05/03/2021 1038 by Santa Lighter, RN Outcome: Progressing Goal: Will not experience complications related to urinary retention Outcome: Adequate for Discharge   Problem: Pain Managment: Goal: General experience of comfort will improve Outcome: Adequate for Discharge   Problem: Safety: Goal: Ability to remain free from injury will  improve Outcome: Adequate for Discharge   Problem: Skin Integrity: Goal: Risk for impaired skin integrity will decrease Outcome: Adequate for Discharge

## 2021-05-03 NOTE — Plan of Care (Signed)
  Problem: Education: Goal: Knowledge of General Education information will improve Description: Including pain rating scale, medication(s)/side effects and non-pharmacologic comfort measures Outcome: Progressing   Problem: Health Behavior/Discharge Planning: Goal: Ability to manage health-related needs will improve Outcome: Progressing   Problem: Activity: Goal: Risk for activity intolerance will decrease Outcome: Progressing   Problem: Elimination: Goal: Will not experience complications related to bowel motility Outcome: Progressing   

## 2021-05-03 NOTE — Progress Notes (Signed)
Nsg Discharge Note  Admit Date:  05/01/2021 Discharge date: 05/03/2021   Domingo Madeira to be D/C'd Home per MD order.  AVS completed.  Copy for chart, and copy for patient signed, and dated. Removed IV-CDI. Reviewed d/c paperwork with patient and mother. Answered all questions. Wheeled stable patient and belongings to main entrance where she was picked up by her mother. Patient/caregiver able to verbalize understanding.  Discharge Medication: Allergies as of 05/03/2021       Reactions   Codeine Anaphylaxis   Iron Sucrose Rash   Azithromycin    Milk-related Compounds Other (See Comments)   Stomach pain   Other Dermatitis   Certain metals         Medication List     TAKE these medications    ferrous sulfate 325 (65 FE) MG tablet Take 325 mg by mouth daily with breakfast.   ibuprofen 600 MG tablet Commonly known as: ADVIL Take 1 tablet (600 mg total) by mouth every 6 (six) hours.   ondansetron 8 MG tablet Commonly known as: ZOFRAN Take 1 tablet (8 mg total) by mouth every 6 (six) hours as needed for nausea.   oxyCODONE 5 MG immediate release tablet Commonly known as: Oxy IR/ROXICODONE Take 1-2 tablets (5-10 mg total) by mouth every 4 (four) hours as needed for moderate pain.               Discharge Care Instructions  (From admission, onward)           Start     Ordered   05/03/21 0000  Leave dressing on - Keep it clean, dry, and intact until clinic visit        05/03/21 1324            Discharge Assessment: Vitals:   05/03/21 0530 05/03/21 1333  BP: 128/74 (!) 153/73  Pulse: 76 78  Resp:  20  Temp: 98.1 F (36.7 C) 98.1 F (36.7 C)  SpO2: 99% 100%   Skin clean, dry and intact without evidence of skin break down, no evidence of skin tears noted. IV catheter discontinued intact. Site without signs and symptoms of complications - no redness or edema noted at insertion site, patient denies c/o pain - only slight tenderness at site.  Dressing with  slight pressure applied.  D/c Instructions-Education: Discharge instructions given to patient/family with verbalized understanding. D/c education completed with patient/family including follow up instructions, medication list, d/c activities limitations if indicated, with other d/c instructions as indicated by MD - patient able to verbalize understanding, all questions fully answered. Patient instructed to return to ED, call 911, or call MD for any changes in condition.  Patient escorted via [place], and D/C home via private auto.  Santa Lighter, RN 05/03/2021 3:23 PM

## 2021-05-13 ENCOUNTER — Encounter: Payer: Self-pay | Admitting: Obstetrics & Gynecology

## 2021-05-13 ENCOUNTER — Ambulatory Visit (INDEPENDENT_AMBULATORY_CARE_PROVIDER_SITE_OTHER): Payer: No Typology Code available for payment source | Admitting: Obstetrics & Gynecology

## 2021-05-13 ENCOUNTER — Other Ambulatory Visit: Payer: Self-pay

## 2021-05-13 VITALS — BP 137/81 | HR 77 | Ht 66.5 in | Wt 275.0 lb

## 2021-05-13 DIAGNOSIS — D649 Anemia, unspecified: Secondary | ICD-10-CM | POA: Diagnosis not present

## 2021-05-13 DIAGNOSIS — Z90710 Acquired absence of both cervix and uterus: Secondary | ICD-10-CM

## 2021-05-13 DIAGNOSIS — Z90722 Acquired absence of ovaries, bilateral: Secondary | ICD-10-CM

## 2021-05-13 DIAGNOSIS — Z9079 Acquired absence of other genital organ(s): Secondary | ICD-10-CM

## 2021-05-13 LAB — POCT HEMOGLOBIN: Hemoglobin: 8.4 g/dL — AB (ref 11–14.6)

## 2021-05-13 NOTE — Progress Notes (Signed)
  HPI: Patient returns for routine postoperative follow-up having undergone supracervical abdominal hysterectomy with BSO on 05/01/21.  The patient's immediate postoperative recovery has been unremarkable. Since hospital discharge the patient reports no problems.   Current Outpatient Medications: ferrous sulfate 325 (65 FE) MG tablet, Take 325 mg by mouth daily with breakfast., Disp: , Rfl:  ibuprofen (ADVIL) 600 MG tablet, Take 1 tablet (600 mg total) by mouth every 6 (six) hours., Disp: 30 tablet, Rfl: 0 ondansetron (ZOFRAN) 8 MG tablet, Take 1 tablet (8 mg total) by mouth every 6 (six) hours as needed for nausea., Disp: 20 tablet, Rfl: 0 oxyCODONE (OXY IR/ROXICODONE) 5 MG immediate release tablet, Take 1-2 tablets (5-10 mg total) by mouth every 4 (four) hours as needed for moderate pain. (Patient not taking: Reported on 05/13/2021), Disp: 15 tablet, Rfl: 0  No current facility-administered medications for this visit.    Blood pressure 137/81, pulse 77, height 5' 6.5" (1.689 m), weight 275 lb (124.7 kg).  Physical Exam: Incision clean dry intact Abdomen is soft normal post op no distention  Diagnostic Tests:   Pathology: benign  Impression:   ICD-10-CM   1. S/P TAH-BSO (total abdominal hysterectomy and bilateral salpingo-oophorectomy), 05/01/21  Z90.710    Z90.722    Z90.79     2. Anemia, unspecified type  D64.9 POCT hemoglobin          Plan: No orders of the defined types were placed in this encounter.    Follow up:  Return in about 4 weeks (around 06/10/2021) for Post Op, Follow up, with [name].   Florian Buff, MD

## 2021-05-16 ENCOUNTER — Encounter (HOSPITAL_COMMUNITY): Payer: Self-pay | Admitting: Emergency Medicine

## 2021-05-16 ENCOUNTER — Emergency Department (HOSPITAL_COMMUNITY): Payer: No Typology Code available for payment source

## 2021-05-16 ENCOUNTER — Other Ambulatory Visit: Payer: Self-pay

## 2021-05-16 ENCOUNTER — Emergency Department (HOSPITAL_COMMUNITY)
Admission: EM | Admit: 2021-05-16 | Discharge: 2021-05-16 | Disposition: A | Discharge status: Home / Self Care | Payer: No Typology Code available for payment source | Attending: Emergency Medicine | Admitting: Emergency Medicine

## 2021-05-16 DIAGNOSIS — K439 Ventral hernia without obstruction or gangrene: Secondary | ICD-10-CM

## 2021-05-16 DIAGNOSIS — R1084 Generalized abdominal pain: Secondary | ICD-10-CM

## 2021-05-16 DIAGNOSIS — R079 Chest pain, unspecified: Secondary | ICD-10-CM | POA: Insufficient documentation

## 2021-05-16 DIAGNOSIS — R109 Unspecified abdominal pain: Secondary | ICD-10-CM | POA: Diagnosis present

## 2021-05-16 DIAGNOSIS — K469 Unspecified abdominal hernia without obstruction or gangrene: Secondary | ICD-10-CM | POA: Diagnosis not present

## 2021-05-16 DIAGNOSIS — R112 Nausea with vomiting, unspecified: Secondary | ICD-10-CM

## 2021-05-16 LAB — CBC WITH DIFFERENTIAL/PLATELET
Abs Immature Granulocytes: 0.14 10*3/uL — ABNORMAL HIGH (ref 0.00–0.07)
Basophils Absolute: 0.1 10*3/uL (ref 0.0–0.1)
Basophils Relative: 0 %
Eosinophils Absolute: 0.1 10*3/uL (ref 0.0–0.5)
Eosinophils Relative: 0 %
HCT: 42 % (ref 36.0–46.0)
Hemoglobin: 12.7 g/dL (ref 12.0–15.0)
Immature Granulocytes: 1 %
Lymphocytes Relative: 16 %
Lymphs Abs: 2.8 10*3/uL (ref 0.7–4.0)
MCH: 28.9 pg (ref 26.0–34.0)
MCHC: 30.2 g/dL (ref 30.0–36.0)
MCV: 95.5 fL (ref 80.0–100.0)
Monocytes Absolute: 0.7 10*3/uL (ref 0.1–1.0)
Monocytes Relative: 4 %
Neutro Abs: 13.7 10*3/uL — ABNORMAL HIGH (ref 1.7–7.7)
Neutrophils Relative %: 79 %
Platelets: 573 10*3/uL — ABNORMAL HIGH (ref 150–400)
RBC: 4.4 MIL/uL (ref 3.87–5.11)
RDW: 16.5 % — ABNORMAL HIGH (ref 11.5–15.5)
WBC: 17.5 10*3/uL — ABNORMAL HIGH (ref 4.0–10.5)
nRBC: 0.2 % (ref 0.0–0.2)

## 2021-05-16 LAB — COMPREHENSIVE METABOLIC PANEL
ALT: 34 U/L (ref 0–44)
AST: 32 U/L (ref 15–41)
Albumin: 4.3 g/dL (ref 3.5–5.0)
Alkaline Phosphatase: 153 U/L — ABNORMAL HIGH (ref 38–126)
Anion gap: 16 — ABNORMAL HIGH (ref 5–15)
BUN: 15 mg/dL (ref 6–20)
CO2: 19 mmol/L — ABNORMAL LOW (ref 22–32)
Calcium: 10.2 mg/dL (ref 8.9–10.3)
Chloride: 101 mmol/L (ref 98–111)
Creatinine, Ser: 0.82 mg/dL (ref 0.44–1.00)
GFR, Estimated: >60 mL/min (ref >60)
Glucose, Bld: 160 mg/dL — ABNORMAL HIGH (ref 70–99)
Potassium: 3.7 mmol/L (ref 3.5–5.1)
Sodium: 136 mmol/L (ref 135–145)
Total Bilirubin: 1.1 mg/dL (ref 0.3–1.2)
Total Protein: 8.9 g/dL — ABNORMAL HIGH (ref 6.5–8.1)

## 2021-05-16 LAB — URINALYSIS, ROUTINE W REFLEX MICROSCOPIC
Bilirubin Urine: NEGATIVE
Glucose, UA: NEGATIVE mg/dL
Hgb urine dipstick: NEGATIVE
Ketones, ur: NEGATIVE mg/dL
Nitrite: NEGATIVE
Protein, ur: NEGATIVE mg/dL
Specific Gravity, Urine: 1.018 (ref 1.005–1.030)
pH: 6 (ref 5.0–8.0)

## 2021-05-16 LAB — CBG MONITORING, ED: Glucose-Capillary: 175 mg/dL — ABNORMAL HIGH (ref 70–99)

## 2021-05-16 LAB — TROPONIN I (HIGH SENSITIVITY)
Troponin I (High Sensitivity): 2 ng/L (ref <18)
Troponin I (High Sensitivity): 3 ng/L (ref <18)

## 2021-05-16 LAB — LIPASE, BLOOD: Lipase: 23 U/L (ref 11–51)

## 2021-05-16 MED ORDER — SODIUM CHLORIDE 0.9 % IV BOLUS
500.0000 mL | Freq: Once | INTRAVENOUS | Status: AC
  Administered 2021-05-16: 500 mL via INTRAVENOUS

## 2021-05-16 MED ORDER — DICYCLOMINE HCL 20 MG PO TABS: 20.0000 mg | ORAL_TABLET | Freq: Three times a day (TID) | ORAL | 0 refills | Status: AC | PRN

## 2021-05-16 MED ORDER — MORPHINE SULFATE (PF) 4 MG/ML IV SOLN
4.0000 mg | Freq: Once | INTRAVENOUS | Status: AC
  Administered 2021-05-16: 4 mg via INTRAVENOUS
  Filled 2021-05-16: qty 1

## 2021-05-16 MED ORDER — ONDANSETRON HCL 4 MG/2ML IJ SOLN
4.0000 mg | Freq: Once | INTRAMUSCULAR | Status: AC
  Administered 2021-05-16: 4 mg via INTRAVENOUS
  Filled 2021-05-16: qty 2

## 2021-05-16 MED ORDER — IOHEXOL 300 MG/ML  SOLN
100.0000 mL | Freq: Once | INTRAMUSCULAR | Status: AC | PRN
  Administered 2021-05-16: 100 mL via INTRAVENOUS

## 2021-05-16 MED ORDER — IOHEXOL 9 MG/ML PO SOLN
ORAL | Status: AC
  Filled 2021-05-16: qty 1000

## 2021-05-16 NOTE — Discharge Instructions (Addendum)
You were seen in the emergency department today with abdominal pain and vomiting.  Your CT scan shows a small hernia which may be causing some discomfort but I suspect you probably have a virus.  If you pain near the bellybutton continues you may discuss this with a surgeon who can discuss options for fixing the hernia.  Please take your Zofran at home.  Have also called in Bentyl.  Return to the emergency department any new or suddenly worsening symptoms.

## 2021-05-16 NOTE — ED Notes (Signed)
Pt ambulated to restroom. 

## 2021-05-16 NOTE — ED Triage Notes (Signed)
Pt arrived by [facility]. Pt started having Nausea/vomiting x1 day ago along with abdominal pain. No relief with zofran at home. Pt actively vomiting in triage.

## 2021-05-16 NOTE — ED Notes (Signed)
Pt sats noted to be 82% on RA while the patient was sleeping. Pt placed on 2L of O2

## 2021-05-16 NOTE — ED Triage Notes (Signed)
Pt had a hysterectomy and tumor removed 2 weeks ago per the pt.

## 2021-05-16 NOTE — ED Provider Notes (Signed)
Emergency Department Provider Note   I have reviewed the triage vital signs and the nursing notes.   HISTORY  Chief Complaint Abdominal Pain   HPI Penny Garza is a 50 y.o. female with past medical history reviewed below including POD 14 s/p hysterectomy with Dr. Elonda Husky presents to the emergency department with abdominal pain along with nausea/vomiting.  Symptoms began yesterday but have progressed through the evening to become more severe this morning.  Patient has had multiple episodes of vomiting.  She took Zofran yesterday with no relief in symptoms.  She has pain going to the center of her abdomen and settling in her lower abdomen.  She denies vaginal bleeding or foul-smelling discharge.  Denies any dysuria, hesitancy, urgency.  She last had a bowel movement yesterday and states that she is passing some small amount of flatus.  No blood in the emesis.  No diarrhea prior to this episode.  No fevers or chills.  She notes some occasional discomfort in her right chest which she associates with vomiting but is also having pain without vomiting.  No other modifying factors.  Past Medical History:  Diagnosis Date   Anemia    Complication of anesthesia    Patient woke up during procedure    Patient Active Problem List   Diagnosis Date Noted   S/P TAH-BSO (total abdominal hysterectomy and bilateral salpingo-oophorectomy) 05/01/2021   Abdominal wall hernia 02/13/2021   Encounter for screening fecal occult blood testing 02/13/2021   Encounter for gynecological examination with Papanicolaou smear of cervix 02/13/2021   History of anemia 08/27/2020   Menorrhagia with regular cycle 08/27/2020   Fibroids 08/27/2020   Depression 08/27/2020    Past Surgical History:  Procedure Laterality Date   BILATERAL SALPINGECTOMY Bilateral 05/01/2021   Procedure: OPEN BILATERAL SALPINGECTOMY and OOPHORECTOMY;  Surgeon: Florian Buff, MD;  Location: [facility];  Service: Gynecology;  Laterality: Bilateral;    COLONOSCOPY     ENDOMETRIAL BIOPSY     IUD REMOVAL     SUPRACERVICAL ABDOMINAL HYSTERECTOMY N/A 05/01/2021   Procedure: HYSTERECTOMY SUPRACERVICAL ABDOMINAL;  Surgeon: Florian Buff, MD;  Location: [facility];  Service: Gynecology;  Laterality: N/A;    Allergies Codeine, Iron sucrose, Azithromycin, Milk-related compounds, and Other  Family History  Problem Relation Age of Onset   Diabetes Paternal Grandfather    Diabetes Paternal Grandmother    Diabetes Maternal Grandfather    Diabetes Father    Hypertension Father    Heart attack Mother     Social History Social History   Tobacco Use   Smoking status: Never   Smokeless tobacco: Never  Vaping Use   Vaping Use: Never used  Substance Use Topics   Alcohol use: Yes   Drug use: Never    Review of Systems  Constitutional: No fever/chills Eyes: No visual changes. ENT: No sore throat. Cardiovascular: Occasional chest pain. Respiratory: Denies shortness of breath. Gastrointestinal: Positive abdominal pain. Positive nausea and vomiting.  No diarrhea.  No constipation. Genitourinary: Negative for dysuria. Musculoskeletal: Negative for back pain. Skin: Negative for rash. Neurological: Negative for headaches, focal weakness or numbness.  10-point ROS otherwise negative.  ____________________________________________   PHYSICAL EXAM:  VITAL SIGNS: Vitals:   05/16/21 1230 05/16/21 1240  BP: 124/86 124/86  Pulse: 84 84  Resp:  20  Temp:    SpO2: 93% 99%    Constitutional: Alert and oriented. Alert and able to provide a history but appears uncomfortable.  Eyes: Conjunctivae are normal.  Head: Atraumatic.  Nose: No congestion/rhinnorhea. Mouth/Throat: Mucous membranes are dry.  Neck: No stridor.   Cardiovascular: Mild tachycardia. Good peripheral circulation. Grossly normal heart sounds.   Respiratory: Normal respiratory effort.  No retractions. Lungs CTAB. Gastrointestinal: Soft with diffuse tenderness worse in the  lower abdomen. No peritoneal exam findings. Incision evaluated and looks clean/dry. No distention.  Musculoskeletal: No gross deformities of extremities. Neurologic:  Normal speech and language.  Skin:  Skin is warm, dry and intact. No rash noted.   ____________________________________________   LABS (all labs ordered are listed, but only abnormal results are displayed)  Labs Reviewed  COMPREHENSIVE METABOLIC PANEL - Abnormal; Notable for the following components:      Result Value   CO2 19 (*)    Glucose, Bld 160 (*)    Total Protein 8.9 (*)    Alkaline Phosphatase 153 (*)    Anion gap 16 (*)    All other components within normal limits  CBC WITH DIFFERENTIAL/PLATELET - Abnormal; Notable for the following components:   WBC 17.5 (*)    RDW 16.5 (*)    Platelets 573 (*)    Neutro Abs 13.7 (*)    Abs Immature Granulocytes 0.14 (*)    All other components within normal limits  URINALYSIS, ROUTINE W REFLEX MICROSCOPIC - Abnormal; Notable for the following components:   APPearance HAZY (*)    Leukocytes,Ua TRACE (*)    Bacteria, UA RARE (*)    All other components within normal limits  CBG MONITORING, ED - Abnormal; Notable for the following components:   Glucose-Capillary 175 (*)    All other components within normal limits  LIPASE, BLOOD  TROPONIN I (HIGH SENSITIVITY)  TROPONIN I (HIGH SENSITIVITY)   ____________________________________________  EKG   EKG Interpretation  Date/Time:  Thursday May 16 2021 08:31:36 EDT Ventricular Rate:  98 PR Interval:  146 QRS Duration: 90 QT Interval:  352 QTC Calculation: 450 R Axis:   58 Text Interpretation: Sinus tachycardia Ventricular premature complex Borderline T abnormalities, anterior leads Similar to Jun 2022 tracing Confirmed by Nanda Quinton [phone]) on 05/16/2021 8:42:48 AM         ____________________________________________  RADIOLOGY  CT ABDOMEN PELVIS W CONTRAST  Result Date: 05/16/2021 CLINICAL DATA:   Periumbilical pain with nausea and vomiting since last night. Recent hysterectomy 2 weeks ago. EXAM: CT ABDOMEN AND PELVIS WITH CONTRAST TECHNIQUE: Multidetector CT imaging of the abdomen and pelvis was performed using the standard protocol following bolus administration of intravenous contrast. CONTRAST:  148mL OMNIPAQUE IOHEXOL 300 MG/ML  SOLN COMPARISON:  None. FINDINGS: Lower chest: No acute abnormality. Hepatobiliary: No focal liver abnormality is seen. No gallstones, gallbladder wall thickening, or biliary dilatation. Pancreas: Unremarkable. No pancreatic ductal dilatation or surrounding inflammatory changes. Spleen: Normal in size without focal abnormality. Adrenals/Urinary Tract: The adrenal glands are unremarkable. Punctate right renal calculus. No hydronephrosis. The bladder is decompressed. Stomach/Bowel: The stomach is within normal limits. No bowel wall thickening, distention, or surrounding inflammatory changes. Vascular/Lymphatic: No significant vascular findings are present. No enlarged abdominal or pelvic lymph nodes. Reproductive: Status post hysterectomy and bilateral salpingo-oophorectomy. No adnexal masses. Other: Small amount of free fluid around the spleen, in the left paracolic gutter, and in the pelvis. No pneumoperitoneum. Small fat containing umbilical hernia. Separate small fat and fluid containing hernia just superior to the umbilicus. Musculoskeletal: No acute or significant osseous findings. IMPRESSION: 1. No bowel obstruction. 2. Small ascites. 3. Punctate nonobstructive right nephrolithiasis. 4. Small fat and fluid containing hernia just superior to  the umbilicus, which can be a source of pain. Separate small fat containing umbilical hernia. Electronically Signed   By: Titus Dubin M.D.   On: 05/16/2021 12:47    ____________________________________________   PROCEDURES  Procedure(s) performed:   Procedures  None   ____________________________________________   INITIAL IMPRESSION / ASSESSMENT AND PLAN / ED COURSE  Pertinent labs & imaging results that were available during my care of the patient were reviewed by me and considered in my medical decision making (see chart for details).   Patient presents to the emergency department with abdominal pain and vomiting in the setting of recent abdominal surgery.  She is postop day 14 from abdominal hysterectomy with no immediate complications.  Unclear if this is related but small bowel obstruction is on my differential.  Lower suspicion for infection this far out related to surgery.  Appendicitis is a consideration.  Patient may also be having a viral process.  Low suspicion that the patient's chest discomfort is related to ACS.  Most of her symptoms seem GI related, however, will obtain a troponin for further evaluation.  EKG is similar to prior tracing from earlier this month.  Labs and imaging reviewed. Question symptomatic hernia but no SBO. Normal labs. Patient feeling much better after IV meds. Ambulatory to the bathroom without difficulty. Doubt hernia is causing all symptoms but did discuss results and give surgery follow up information. Plan for Zofran at home and bentyl PRN pain. No post-operative complication. Patient comfortable with the plan at discharge. Discussed hydration strategies and ED return precautions in detail.  ____________________________________________  FINAL CLINICAL IMPRESSION(S) / ED DIAGNOSES  Final diagnoses:  Generalized abdominal pain  Non-intractable vomiting with nausea, unspecified vomiting type  Hernia of abdominal wall     MEDICATIONS GIVEN DURING THIS VISIT:  Medications  iohexol (OMNIPAQUE) 9 MG/ML oral solution (has no administration in time range)  sodium chloride 0.9 % bolus 500 mL (0 mLs Intravenous Stopped 05/16/21 1024)  morphine 4 MG/ML injection 4 mg (4 mg Intravenous Given 05/16/21 0839)  ondansetron  (ZOFRAN) injection 4 mg (4 mg Intravenous Given 05/16/21 0839)  iohexol (OMNIPAQUE) 300 MG/ML solution 100 mL (100 mLs Intravenous Contrast Given 05/16/21 1151)     NEW OUTPATIENT MEDICATIONS STARTED DURING THIS VISIT:  Discharge Medication List as of 05/16/2021  1:39 PM     START taking these medications   Details  dicyclomine (BENTYL) 20 MG tablet Take 1 tablet (20 mg total) by mouth 3 (three) times daily as needed., Starting Thu 05/16/2021, Normal        Note:  This document was prepared using Dragon voice recognition software and may include unintentional dictation errors.  Nanda Quinton, MD, [facility] Emergency Medicine    Jetaun Colbath, [facility], MD 05/16/21 [phone]

## 2021-05-20 ENCOUNTER — Inpatient Hospital Stay (HOSPITAL_COMMUNITY): Payer: 59

## 2021-05-20 ENCOUNTER — Encounter (HOSPITAL_COMMUNITY): Payer: Self-pay | Admitting: *Deleted

## 2021-05-20 ENCOUNTER — Inpatient Hospital Stay (HOSPITAL_COMMUNITY)
Admission: EM | Admit: 2021-05-20 | Discharge: 2021-05-29 | DRG: 330 | Disposition: A | Discharge status: Home / Self Care | Payer: 59 | Attending: Internal Medicine | Admitting: Internal Medicine

## 2021-05-20 ENCOUNTER — Observation Stay (HOSPITAL_COMMUNITY): Payer: 59

## 2021-05-20 DIAGNOSIS — K8020 Calculus of gallbladder without cholecystitis without obstruction: Secondary | ICD-10-CM | POA: Diagnosis present

## 2021-05-20 DIAGNOSIS — K565 Intestinal adhesions [bands], unspecified as to partial versus complete obstruction: Secondary | ICD-10-CM | POA: Diagnosis not present

## 2021-05-20 DIAGNOSIS — K760 Fatty (change of) liver, not elsewhere classified: Secondary | ICD-10-CM | POA: Diagnosis present

## 2021-05-20 DIAGNOSIS — K9189 Other postprocedural complications and disorders of digestive system: Secondary | ICD-10-CM | POA: Diagnosis not present

## 2021-05-20 DIAGNOSIS — R030 Elevated blood-pressure reading, without diagnosis of hypertension: Secondary | ICD-10-CM | POA: Diagnosis not present

## 2021-05-20 DIAGNOSIS — Z79899 Other long term (current) drug therapy: Secondary | ICD-10-CM | POA: Diagnosis not present

## 2021-05-20 DIAGNOSIS — Z91048 Other nonmedicinal substance allergy status: Secondary | ICD-10-CM | POA: Diagnosis not present

## 2021-05-20 DIAGNOSIS — K567 Ileus, unspecified: Secondary | ICD-10-CM | POA: Diagnosis not present

## 2021-05-20 DIAGNOSIS — Y658 Other specified misadventures during surgical and medical care: Secondary | ICD-10-CM | POA: Diagnosis not present

## 2021-05-20 DIAGNOSIS — R109 Unspecified abdominal pain: Secondary | ICD-10-CM

## 2021-05-20 DIAGNOSIS — R188 Other ascites: Secondary | ICD-10-CM | POA: Diagnosis present

## 2021-05-20 DIAGNOSIS — Z6841 Body Mass Index (BMI) 40.0 and over, adult: Secondary | ICD-10-CM | POA: Diagnosis not present

## 2021-05-20 DIAGNOSIS — R7989 Other specified abnormal findings of blood chemistry: Secondary | ICD-10-CM | POA: Diagnosis not present

## 2021-05-20 DIAGNOSIS — Z978 Presence of other specified devices: Secondary | ICD-10-CM

## 2021-05-20 DIAGNOSIS — Z20822 Contact with and (suspected) exposure to covid-19: Secondary | ICD-10-CM | POA: Diagnosis present

## 2021-05-20 DIAGNOSIS — Z888 Allergy status to other drugs, medicaments and biological substances status: Secondary | ICD-10-CM | POA: Diagnosis not present

## 2021-05-20 DIAGNOSIS — Z91011 Allergy to milk products: Secondary | ICD-10-CM | POA: Diagnosis not present

## 2021-05-20 DIAGNOSIS — Z90722 Acquired absence of ovaries, bilateral: Secondary | ICD-10-CM | POA: Diagnosis not present

## 2021-05-20 DIAGNOSIS — R059 Cough, unspecified: Secondary | ICD-10-CM

## 2021-05-20 DIAGNOSIS — Z90710 Acquired absence of both cervix and uterus: Secondary | ICD-10-CM

## 2021-05-20 DIAGNOSIS — K9171 Accidental puncture and laceration of a digestive system organ or structure during a digestive system procedure: Secondary | ICD-10-CM | POA: Diagnosis not present

## 2021-05-20 DIAGNOSIS — D649 Anemia, unspecified: Secondary | ICD-10-CM | POA: Diagnosis present

## 2021-05-20 DIAGNOSIS — Z881 Allergy status to other antibiotic agents status: Secondary | ICD-10-CM

## 2021-05-20 DIAGNOSIS — Z885 Allergy status to narcotic agent status: Secondary | ICD-10-CM | POA: Diagnosis not present

## 2021-05-20 DIAGNOSIS — R1084 Generalized abdominal pain: Secondary | ICD-10-CM | POA: Diagnosis not present

## 2021-05-20 DIAGNOSIS — F502 Bulimia nervosa: Secondary | ICD-10-CM

## 2021-05-20 DIAGNOSIS — K5651 Intestinal adhesions [bands], with partial obstruction: Secondary | ICD-10-CM | POA: Diagnosis present

## 2021-05-20 DIAGNOSIS — K436 Other and unspecified ventral hernia with obstruction, without gangrene: Secondary | ICD-10-CM

## 2021-05-20 DIAGNOSIS — R1031 Right lower quadrant pain: Secondary | ICD-10-CM | POA: Diagnosis not present

## 2021-05-20 DIAGNOSIS — R1013 Epigastric pain: Secondary | ICD-10-CM

## 2021-05-20 DIAGNOSIS — E876 Hypokalemia: Secondary | ICD-10-CM | POA: Diagnosis present

## 2021-05-20 DIAGNOSIS — R112 Nausea with vomiting, unspecified: Secondary | ICD-10-CM | POA: Diagnosis present

## 2021-05-20 DIAGNOSIS — Z9079 Acquired absence of other genital organ(s): Secondary | ICD-10-CM | POA: Diagnosis not present

## 2021-05-20 LAB — CBC WITH DIFFERENTIAL/PLATELET
Abs Immature Granulocytes: 0.02 10*3/uL (ref 0.00–0.07)
Basophils Absolute: 0 10*3/uL (ref 0.0–0.1)
Basophils Relative: 0 %
Eosinophils Absolute: 0 10*3/uL (ref 0.0–0.5)
Eosinophils Relative: 1 %
HCT: 34.8 % — ABNORMAL LOW (ref 36.0–46.0)
Hemoglobin: 10.4 g/dL — ABNORMAL LOW (ref 12.0–15.0)
Immature Granulocytes: 0 %
Lymphocytes Relative: 21 %
Lymphs Abs: 1.2 10*3/uL (ref 0.7–4.0)
MCH: 28 pg (ref 26.0–34.0)
MCHC: 29.9 g/dL — ABNORMAL LOW (ref 30.0–36.0)
MCV: 93.8 fL (ref 80.0–100.0)
Monocytes Absolute: 0.5 10*3/uL (ref 0.1–1.0)
Monocytes Relative: 9 %
Neutro Abs: 3.8 10*3/uL (ref 1.7–7.7)
Neutrophils Relative %: 69 %
Platelets: 405 10*3/uL — ABNORMAL HIGH (ref 150–400)
RBC: 3.71 MIL/uL — ABNORMAL LOW (ref 3.87–5.11)
RDW: 15.8 % — ABNORMAL HIGH (ref 11.5–15.5)
WBC: 5.5 10*3/uL (ref 4.0–10.5)
nRBC: 0 % (ref 0.0–0.2)

## 2021-05-20 LAB — RESP PANEL BY RT-PCR (FLU A&B, COVID) ARPGX2
Influenza A by PCR: NEGATIVE
Influenza B by PCR: NEGATIVE
SARS Coronavirus 2 by RT PCR: NEGATIVE

## 2021-05-20 LAB — COMPREHENSIVE METABOLIC PANEL
ALT: 80 U/L — ABNORMAL HIGH (ref 0–44)
AST: 84 U/L — ABNORMAL HIGH (ref 15–41)
Albumin: 4 g/dL (ref 3.5–5.0)
Alkaline Phosphatase: 128 U/L — ABNORMAL HIGH (ref 38–126)
Anion gap: 11 (ref 5–15)
BUN: 9 mg/dL (ref 6–20)
CO2: 25 mmol/L (ref 22–32)
Calcium: 9.5 mg/dL (ref 8.9–10.3)
Chloride: 105 mmol/L (ref 98–111)
Creatinine, Ser: 0.75 mg/dL (ref 0.44–1.00)
GFR, Estimated: >60 mL/min (ref >60)
Glucose, Bld: 95 mg/dL (ref 70–99)
Potassium: 3.4 mmol/L — ABNORMAL LOW (ref 3.5–5.1)
Sodium: 141 mmol/L (ref 135–145)
Total Bilirubin: 1.8 mg/dL — ABNORMAL HIGH (ref 0.3–1.2)
Total Protein: 8.4 g/dL — ABNORMAL HIGH (ref 6.5–8.1)

## 2021-05-20 LAB — URINALYSIS, ROUTINE W REFLEX MICROSCOPIC
Bilirubin Urine: NEGATIVE
Glucose, UA: NEGATIVE mg/dL
Ketones, ur: 80 mg/dL — AB
Leukocytes,Ua: NEGATIVE
Nitrite: NEGATIVE
Protein, ur: 30 mg/dL — AB
Specific Gravity, Urine: 1.021 (ref 1.005–1.030)
pH: 7 (ref 5.0–8.0)

## 2021-05-20 LAB — LIPASE, BLOOD: Lipase: 20 U/L (ref 11–51)

## 2021-05-20 MED ORDER — HYDRALAZINE HCL 20 MG/ML IJ SOLN: 5.0000 mg | INTRAMUSCULAR | Status: DC | PRN

## 2021-05-20 MED ORDER — ONDANSETRON HCL 4 MG/2ML IJ SOLN: 4.0000 mg | Freq: Once | INTRAMUSCULAR | Status: DC

## 2021-05-20 MED ORDER — FENTANYL CITRATE (PF) 100 MCG/2ML IJ SOLN
25.0000 ug | INTRAMUSCULAR | Status: DC | PRN
  Administered 2021-05-21 – 2021-05-24 (×6): 25 ug via INTRAVENOUS
  Filled 2021-05-20 (×6): qty 2

## 2021-05-20 MED ORDER — FENTANYL CITRATE (PF) 100 MCG/2ML IJ SOLN
100.0000 ug | Freq: Once | INTRAMUSCULAR | Status: AC
  Administered 2021-05-20: 100 ug via INTRAVENOUS
  Filled 2021-05-20: qty 2

## 2021-05-20 MED ORDER — KCL IN DEXTROSE-NACL 20-5-0.9 MEQ/L-%-% IV SOLN
INTRAVENOUS | Status: AC
  Filled 2021-05-20 (×4): qty 1000

## 2021-05-20 MED ORDER — IOHEXOL 300 MG/ML  SOLN
100.0000 mL | Freq: Once | INTRAMUSCULAR | Status: AC | PRN
  Administered 2021-05-20: 100 mL via INTRAVENOUS

## 2021-05-20 MED ORDER — FENTANYL CITRATE (PF) 100 MCG/2ML IJ SOLN: 50.0000 ug | Freq: Once | INTRAMUSCULAR | Status: DC

## 2021-05-20 MED ORDER — SODIUM CHLORIDE 0.9 % IV SOLN
1000.0000 mL | Freq: Once | INTRAVENOUS | Status: AC
  Administered 2021-05-20: 1000 mL via INTRAVENOUS

## 2021-05-20 NOTE — H&P (Signed)
History and Physical    Penny Garza XBD:[id number] DOB: 10-12-1971 DOA: 05/20/2021  PCP: [place]  Patient coming from: Home.  Chief Complaint: Nausea vomiting.  HPI: Penny Garza is a 50 y.o. female with no significant past medical history presents to the ER for the second time in 5 days with complaints of nausea vomiting abdominal discomfort.  Patient comes to the ER on June 30 with complaints of abdominal pain nausea vomiting.  At that time CT abdomen pelvis did not show any acute except for a fat-containing hernia.  Also showed mild ascites.  Patient was discharged home.  Despite taking antispasmodics and pain relief medications patient's nausea vomiting abdominal discomfort did not improve and patient comes to the ER again.  Patient's last bowel movement was 5 days ago.  Patient has had a total abdominal hysterectomy on May 01, 2021 last month and was discharged on May 03, 2021.  Patient states she did fine until June 30 when she started developing the nausea vomiting abdominal discomfort.  No discharge from the surgical sites.  Denies any fever chills.  ED Course: In the ER patient was afebrile.  Labs show mildly elevated LFTs compared to the last time.  Hemoglobin is around 10.4 lipase is normal COVID test is pending.  Acute abdominal series done showed possible small bowel obstruction.  CT abdomen pelvis is pending.  Review of Systems: As per HPI, rest all negative.   Past Medical History:  Diagnosis Date   Anemia    Complication of anesthesia    Patient woke up during procedure    Past Surgical History:  Procedure Laterality Date   BILATERAL SALPINGECTOMY Bilateral 05/01/2021   Procedure: OPEN BILATERAL SALPINGECTOMY and OOPHORECTOMY;  Surgeon: Florian Buff, MD;  Location: [facility];  Service: Gynecology;  Laterality: Bilateral;   COLONOSCOPY     ENDOMETRIAL BIOPSY     IUD REMOVAL     SUPRACERVICAL ABDOMINAL HYSTERECTOMY N/A 05/01/2021   Procedure: HYSTERECTOMY  SUPRACERVICAL ABDOMINAL;  Surgeon: Florian Buff, MD;  Location: [facility];  Service: Gynecology;  Laterality: N/A;     reports that she has never smoked. She has never used smokeless tobacco. She reports current alcohol use. She reports that she does not use drugs.  Allergies  Allergen Reactions   Codeine Anaphylaxis   Iron Sucrose Rash   Azithromycin    Milk-Related Compounds Other (See Comments)    Stomach pain   Other Dermatitis    Certain metals     Family History  Problem Relation Age of Onset   Diabetes Paternal Grandfather    Diabetes Paternal Grandmother    Diabetes Maternal Grandfather    Diabetes Father    Hypertension Father    Heart attack Mother     Prior to Admission medications   Medication Sig Start Date End Date Taking? Authorizing Provider  dicyclomine (BENTYL) 20 MG tablet Take 1 tablet (20 mg total) by mouth 3 (three) times daily as needed. 05/16/21   Long, Wonda Olds, MD  ferrous sulfate 325 (65 FE) MG tablet Take 325 mg by mouth daily with breakfast.    [provider]  ibuprofen (ADVIL) 600 MG tablet Take 1 tablet (600 mg total) by mouth every 6 (six) hours. 05/03/21   Florian Buff, MD  ondansetron (ZOFRAN) 8 MG tablet Take 1 tablet (8 mg total) by mouth every 6 (six) hours as needed for nausea. 05/03/21   Florian Buff, MD  oxyCODONE (OXY IR/ROXICODONE) 5 MG immediate  release tablet Take 1-2 tablets (5-10 mg total) by mouth every 4 (four) hours as needed for moderate pain. Patient not taking: No sig reported 05/03/21   Florian Buff, MD    Physical Exam: Constitutional: Moderately built and nourished. Vitals:   05/20/21 1610 05/20/21 1742  BP: (!) 142/80 (!) 160/83  Pulse: 92 80  Resp: 20 (!) 21  Temp: 99 F (37.2 C)   TempSrc: Oral   SpO2: 98% 94%   Eyes: Anicteric no pallor. ENMT: No discharge from the ears eyes nose and mouth. Neck: No mass felt.  No neck rigidity. Respiratory: No rhonchi or crepitations. Cardiovascular: S1-S2  heard. Abdomen: Distended nontender bowel sounds not appreciated. Musculoskeletal: No edema. Skin: No rash. Neurologic: Alert awake oriented to time place and person.  Moves all extremities. Psychiatric: Appears normal.  Normal affect.   Labs on Admission: I have personally reviewed following labs and imaging studies  CBC: Recent Labs  Lab 05/16/21 0911 05/20/21 1652  WBC 17.5* 5.5  NEUTROABS 13.7* 3.8  HGB 12.7 10.4*  HCT 42.0 34.8*  MCV 95.5 93.8  PLT 573* 518*   Basic Metabolic Panel: Recent Labs  Lab 05/16/21 0911 05/20/21 1652  NA 136 141  K 3.7 3.4*  CL 101 105  CO2 19* 25  GLUCOSE 160* 95  BUN 15 9  CREATININE 0.82 0.75  CALCIUM 10.2 9.5   GFR: Estimated Creatinine Clearance: 114.6 mL/min (by C-G formula based on SCr of 0.75 mg/dL). Liver Function Tests: Recent Labs  Lab 05/16/21 0911 05/20/21 1652  AST 32 84*  ALT 34 80*  ALKPHOS 153* 128*  BILITOT 1.1 1.8*  PROT 8.9* 8.4*  ALBUMIN 4.3 4.0   Recent Labs  Lab 05/16/21 0911 05/20/21 1652  LIPASE 23 20   No results for input(s): AMMONIA in the last 168 hours. Coagulation Profile: No results for input(s): INR, PROTIME in the last 168 hours. Cardiac Enzymes: No results for input(s): CKTOTAL, CKMB, CKMBINDEX, TROPONINI in the last 168 hours. BNP (last 3 results) No results for input(s): PROBNP in the last 8760 hours. HbA1C: No results for input(s): HGBA1C in the last 72 hours. CBG: Recent Labs  Lab 05/16/21 0833  GLUCAP 175*   Lipid Profile: No results for input(s): CHOL, HDL, LDLCALC, TRIG, CHOLHDL, LDLDIRECT in the last 72 hours. Thyroid Function Tests: No results for input(s): TSH, T4TOTAL, FREET4, T3FREE, THYROIDAB in the last 72 hours. Anemia Panel: No results for input(s): VITAMINB12, FOLATE, FERRITIN, TIBC, IRON, RETICCTPCT in the last 72 hours. Urine analysis:    Component Value Date/Time   COLORURINE YELLOW 05/20/2021 1727   APPEARANCEUR CLOUDY (A) 05/20/2021 1727   LABSPEC  1.021 05/20/2021 1727   PHURINE 7.0 05/20/2021 1727   GLUCOSEU NEGATIVE 05/20/2021 1727   HGBUR SMALL (A) 05/20/2021 1727   BILIRUBINUR NEGATIVE 05/20/2021 1727   KETONESUR 80 (A) 05/20/2021 1727   PROTEINUR 30 (A) 05/20/2021 1727   NITRITE NEGATIVE 05/20/2021 1727   LEUKOCYTESUR NEGATIVE 05/20/2021 1727   Sepsis Labs: @LABRCNTIP (procalcitonin:4,lacticidven:4) )No results found for this or any previous visit (from the past 240 hour(s)).   Radiological Exams on Admission: DG ABD ACUTE 2+V W 1V CHEST  Result Date: 05/20/2021 CLINICAL DATA:  Abdominal cramping with nausea EXAM: DG ABDOMEN ACUTE WITH 1 VIEW CHEST COMPARISON:  CT 05/16/2021 FINDINGS: Single-view chest demonstrates no focal opacity or pleural effusion. Normal cardiomediastinal silhouette. Supine and upright views of the abdomen demonstrate no free air beneath the diaphragm. Multiple dilated loops of small bowel measuring up  to 4.4 cm with multiple fluid levels on upright exam and relative paucity of distal gas could concerning for a bowel obstruction. IMPRESSION: Findings suspicious for small bowel obstruction. No acute cardiopulmonary disease. Electronically Signed   By: Donavan Foil M.D.   On: 05/20/2021 20:07      Assessment/Plan Principal Problem:   Nausea & vomiting Active Problems:   S/P TAH-BSO (total abdominal hysterectomy and bilateral salpingo-oophorectomy)    Intractable nausea vomiting with possible bowel obstruction for which I have ordered another CT abdomen pelvis with contrast to see if there is severe obstruction or any transition point.  We will keep patient n.p.o. until then and IV fluids pain medications.  Based on the CT findings we will have further plans. Elevated blood pressure readings -we will closely follow blood pressure trends.  Patient does not have any history of hypertension.  As needed IV hydralazine for systolic more than 493. Elevated LFTs cause not clear.  ER physician did discuss with  on-call GI will be seeing patient in consult they requested sonogram of the abdomen.  We are also getting CT at this time to further study about bowel obstruction.  Follow LFTs.  Check a.  Breast panel with next blood draw. Recent total abdominal hysterectomy done by Dr. Elonda Husky.  Please notify Dr. Elonda Husky in the morning. Anemia follow CBC.  He is to take iron pills.  Since patient has possible bowel obstruction will need close monitoring for any further worsening inpatient status.   COVID test is pending.   DVT prophylaxis: SCDs.  Avoiding anticoagulation until we get the CT scan results. Code Status: Full code. Family Communication: Patient's mother at the bedside. Disposition Plan: Home. Consults called: ER physician discussed with on-call gastroenterologist. Admission status: Inpatient.   Rise Patience MD [facility] Pager [phone].  If 7PM-7AM, please contact night-coverage [email or web address] Password [email or web address]  05/20/2021, 9:00 PM

## 2021-05-20 NOTE — ED Triage Notes (Signed)
Diagnosed with abdominal hernia on 6/30. Here today because she is unable to eat, states everything she eats causes pain

## 2021-05-20 NOTE — ED Provider Notes (Signed)
[facility] EMERGENCY DEPARTMENT Provider Note   CSN: [id number] Arrival date & time: 05/20/21  1523     History Chief Complaint  Patient presents with   Abdominal Pain    Penny Garza is a 50 y.o. female.  HPI   Patient presents with 1 week of abdominal cramping, nausea, vomiting.  She had a hysterectomy a few weeks ago, was evaluated postop Monday last week.  Cleared, but seen in the ED that Wednesday for intractable vomiting.  She was evaluated and discharged with diagnosis of hernia with Zofran and Bentyl.  Patient states neither is helping, as she is still vomiting out the medicine.  Patient reports the abdominal cramping has continued since then.  Currently a 5 out of 10, starts as a dull ache and cramp in the epigastric area but moves to the upper right quadrant.  It then spreads everywhere in her abdominal cavity and starts to feel like a cat is scratching at her intestines.  She is still having upwards of 5 episodes of emesis daily.  Unable to keep her medicine down.  States Zofran is not helping.  Pain is exacerbated by movement, as well as touch.  She does not have any fevers, but states she has been having chills.  She did not have any diarrhea, has been feeling constipated.  She passed gas this morning.  No other abdominal surgeries besides a hysterectomy.  Past Medical History:  Diagnosis Date   Anemia    Complication of anesthesia    Patient woke up during procedure    Patient Active Problem List   Diagnosis Date Noted   S/P TAH-BSO (total abdominal hysterectomy and bilateral salpingo-oophorectomy) 05/01/2021   Abdominal wall hernia 02/13/2021   Encounter for screening fecal occult blood testing 02/13/2021   Encounter for gynecological examination with Papanicolaou smear of cervix 02/13/2021   History of anemia 08/27/2020   Menorrhagia with regular cycle 08/27/2020   Fibroids 08/27/2020   Depression 08/27/2020    Past Surgical History:  Procedure Laterality Date    BILATERAL SALPINGECTOMY Bilateral 05/01/2021   Procedure: OPEN BILATERAL SALPINGECTOMY and OOPHORECTOMY;  Surgeon: Florian Buff, MD;  Location: [facility];  Service: Gynecology;  Laterality: Bilateral;   COLONOSCOPY     ENDOMETRIAL BIOPSY     IUD REMOVAL     SUPRACERVICAL ABDOMINAL HYSTERECTOMY N/A 05/01/2021   Procedure: HYSTERECTOMY SUPRACERVICAL ABDOMINAL;  Surgeon: Florian Buff, MD;  Location: [facility];  Service: Gynecology;  Laterality: N/A;     OB History     Gravida  0   Para  0   Term  0   Preterm  0   AB  0   Living  0      SAB  0   IAB  0   Ectopic  0   Multiple  0   Live Births  0           Family History  Problem Relation Age of Onset   Diabetes Paternal Grandfather    Diabetes Paternal Grandmother    Diabetes Maternal Grandfather    Diabetes Father    Hypertension Father    Heart attack Mother     Social History   Tobacco Use   Smoking status: Never   Smokeless tobacco: Never  Vaping Use   Vaping Use: Never used  Substance Use Topics   Alcohol use: Yes   Drug use: Never    Home Medications Prior to Admission medications   Medication Sig Start Date  End Date Taking? Authorizing Provider  dicyclomine (BENTYL) 20 MG tablet Take 1 tablet (20 mg total) by mouth 3 (three) times daily as needed. 05/16/21   Long, Wonda Olds, MD  ferrous sulfate 325 (65 FE) MG tablet Take 325 mg by mouth daily with breakfast.    [provider]  ibuprofen (ADVIL) 600 MG tablet Take 1 tablet (600 mg total) by mouth every 6 (six) hours. 05/03/21   Florian Buff, MD  ondansetron (ZOFRAN) 8 MG tablet Take 1 tablet (8 mg total) by mouth every 6 (six) hours as needed for nausea. 05/03/21   Florian Buff, MD  oxyCODONE (OXY IR/ROXICODONE) 5 MG immediate release tablet Take 1-2 tablets (5-10 mg total) by mouth every 4 (four) hours as needed for moderate pain. Patient not taking: No sig reported 05/03/21   Florian Buff, MD    Allergies    Codeine, Iron  sucrose, Azithromycin, Milk-related compounds, and Other  Review of Systems   Review of Systems  Constitutional:  Positive for appetite change, chills and fatigue. Negative for fever.  HENT:  Negative for ear pain and sore throat.   Eyes:  Negative for pain and visual disturbance.  Respiratory:  Negative for cough and shortness of breath.   Cardiovascular:  Negative for chest pain, palpitations and leg swelling.  Gastrointestinal:  Positive for abdominal pain, constipation, nausea and vomiting. Negative for anal bleeding, blood in stool and diarrhea.  Genitourinary:  Negative for dysuria and hematuria.  Musculoskeletal:  Negative for arthralgias and back pain.  Skin:  Negative for color change and rash.  Neurological:  Negative for seizures and syncope.  All other systems reviewed and are negative.  Physical Exam Updated Vital Signs BP (!) 142/80   Pulse 92   Temp 99 F (37.2 C) (Oral)   Resp 20   LMP 02/27/2021 (Approximate)   SpO2 98%   Physical Exam Vitals and nursing note reviewed. Exam conducted with a chaperone present.  Constitutional:      Appearance: Normal appearance. She is obese.     Comments: Appears tired, but is sitting upright uncomfortably.  HENT:     Head: Normocephalic and atraumatic.  Eyes:     General: No scleral icterus.       Right eye: No discharge.        Left eye: No discharge.     Extraocular Movements: Extraocular movements intact.     Pupils: Pupils are equal, round, and reactive to light.  Cardiovascular:     Rate and Rhythm: Normal rate and regular rhythm.     Pulses: Normal pulses.     Heart sounds: Normal heart sounds. No murmur heard.   No friction rub. No gallop.  Pulmonary:     Effort: Pulmonary effort is normal. No respiratory distress.     Breath sounds: Normal breath sounds.  Abdominal:     General: Abdomen is flat. A surgical scar is present. Bowel sounds are normal. There is no distension.     Palpations: Abdomen is soft.      Tenderness: There is abdominal tenderness in the right upper quadrant, epigastric area and periumbilical area.     Hernia: A hernia is present. Hernia is present in the umbilical area.     Comments: Patient is tender to the epigastric, periumbilical, right upper quadrant regions.  There is no guarding or rigidity.  Surgical scar across the abdomen from hysterectomy, appears well-healed without signs of erythema, exudate, induration, weeping.  There is an  umbilical hernia which is soft and reducible.  She is very tender to palpation in that area.  Bowel sounds are present and normoactive in all quadrants.  Musculoskeletal:     Comments: No unilateral leg swelling,  Skin:    General: Skin is warm and dry.     Coloration: Skin is not jaundiced.  Neurological:     Mental Status: She is alert. Mental status is at baseline.     Coordination: Coordination normal.    ED Results / Procedures / Treatments   Labs (all labs ordered are listed, but only abnormal results are displayed) Labs Reviewed - No data to display  EKG None  Radiology No results found.  Procedures Procedures   Medications Ordered in ED Medications - No data to display  ED Course  I have reviewed the triage vital signs and the nursing notes.  Pertinent labs & imaging results that were available during my care of the patient were reviewed by me and considered in my medical decision making (see chart for details).  Clinical Course as of 05/20/21 1922  Mon May 20, 2021  1813 WBC has improved to baseline, but she is anemic compared to 4 days ago.  Hg dropped 2 units, RBC dropped 1 unit.  However, patient is typically anemic and this is significantly improved from 2 weeks ago.  See MDM for hemoglobin trending. [HS]  1819 Lipase, blood Not consistent with pancreatitis [HS]  1820 Potassium(!): 3.4 Mildly hypokalemic from 4 days ago at 3.7 [HS]  1820 Comprehensive metabolic panel(!) AST and ALT are mildly elevated from 4  days ago.  Alk phos is roughly at baseline.  Bili has increased to 1.8 from 1.1. [HS]    Clinical Course User Index [HS] Sherrill Raring, PA-C   MDM Rules/Calculators/A&P                          Patient is a 50 year old female status post a hysterectomy presenting with abdominal pain, nausea, vomiting.  Will recheck labs and treat nausea and pain.  Patient had CT scan 4 days ago so I will hold off on ordering that for now.  I have low suspicion for SBO given recent work-up.  Some concern for postop vomiting causing electrolyte abnormalities.  There is some concern that this is unrelated to her surgery and is due to gallbladder or liver disease.  I have low suspicion for pancreatitis but I will assess for it as well given epigastric pain.  The hernia is soft and reducible so I have low suspicion that this is the ultimate cause of her pain.  Does not appear incarcerated or strangled requiring emergency surgery.  Please see the ED course for interpretation of work-up.  Given the increase in her AST, ALT, bilirubin and she needs a right upper quadrant ultrasound to assess for gallbladder disease.  She is hemodynamically stable and appropriate for admission overnight.  Spoke to Dr. Abbey Chatters with Gi who is happy to see the patient tomorrow after her ultrasound.   Spoke with the hospitalist Dr. Reece Levy who is also happy to admit.  Discussed HPI, physical exam and plan of care for this patient with attending Verneda Skill. The attending physician evaluated this patient as part of a shared visit and agrees with plan of care.   Hemoglobin  Date Value Ref Range Status  05/20/2021 10.4 (L) 12.0 - 15.0 g/dL Final  05/16/2021 12.7 12.0 - 15.0 g/dL Final  05/13/2021 8.4 (  A) 11 - 14.6 g/dL Final  05/03/2021 7.8 (L) 12.0 - 15.0 g/dL Final  05/02/2021 7.7 (L) 12.0 - 15.0 g/dL Final    Final Clinical Impression(s) / ED Diagnoses Final diagnoses:  None    Rx / DC Orders ED Discharge Orders     None         Sherrill Raring, Hershal Coria 05/20/21 1926    Milton Ferguson, MD 05/21/21 [phone]

## 2021-05-21 ENCOUNTER — Encounter (HOSPITAL_COMMUNITY): Payer: Self-pay | Admitting: Internal Medicine

## 2021-05-21 ENCOUNTER — Inpatient Hospital Stay (HOSPITAL_COMMUNITY): Payer: 59

## 2021-05-21 ENCOUNTER — Telehealth: Payer: Self-pay | Admitting: Gastroenterology

## 2021-05-21 DIAGNOSIS — R109 Unspecified abdominal pain: Secondary | ICD-10-CM

## 2021-05-21 DIAGNOSIS — R112 Nausea with vomiting, unspecified: Secondary | ICD-10-CM

## 2021-05-21 DIAGNOSIS — R7989 Other specified abnormal findings of blood chemistry: Secondary | ICD-10-CM

## 2021-05-21 DIAGNOSIS — R1084 Generalized abdominal pain: Secondary | ICD-10-CM

## 2021-05-21 LAB — CBC WITH DIFFERENTIAL/PLATELET
Abs Immature Granulocytes: 0.01 10*3/uL (ref 0.00–0.07)
Basophils Absolute: 0 10*3/uL (ref 0.0–0.1)
Basophils Relative: 0 %
Eosinophils Absolute: 0 10*3/uL (ref 0.0–0.5)
Eosinophils Relative: 1 %
HCT: 31.9 % — ABNORMAL LOW (ref 36.0–46.0)
Hemoglobin: 9.4 g/dL — ABNORMAL LOW (ref 12.0–15.0)
Immature Granulocytes: 0 %
Lymphocytes Relative: 28 %
Lymphs Abs: 1.3 10*3/uL (ref 0.7–4.0)
MCH: 27.8 pg (ref 26.0–34.0)
MCHC: 29.5 g/dL — ABNORMAL LOW (ref 30.0–36.0)
MCV: 94.4 fL (ref 80.0–100.0)
Monocytes Absolute: 0.5 10*3/uL (ref 0.1–1.0)
Monocytes Relative: 11 %
Neutro Abs: 2.7 10*3/uL (ref 1.7–7.7)
Neutrophils Relative %: 60 %
Platelets: 364 10*3/uL (ref 150–400)
RBC: 3.38 MIL/uL — ABNORMAL LOW (ref 3.87–5.11)
RDW: 16 % — ABNORMAL HIGH (ref 11.5–15.5)
WBC: 4.5 10*3/uL (ref 4.0–10.5)
nRBC: 0 % (ref 0.0–0.2)

## 2021-05-21 LAB — BASIC METABOLIC PANEL
Anion gap: 8 (ref 5–15)
BUN: 8 mg/dL (ref 6–20)
CO2: 25 mmol/L (ref 22–32)
Calcium: 8.9 mg/dL (ref 8.9–10.3)
Chloride: 107 mmol/L (ref 98–111)
Creatinine, Ser: 0.63 mg/dL (ref 0.44–1.00)
GFR, Estimated: >60 mL/min (ref >60)
Glucose, Bld: 110 mg/dL — ABNORMAL HIGH (ref 70–99)
Potassium: 3.2 mmol/L — ABNORMAL LOW (ref 3.5–5.1)
Sodium: 140 mmol/L (ref 135–145)

## 2021-05-21 LAB — HEPATIC FUNCTION PANEL
ALT: 73 U/L — ABNORMAL HIGH (ref 0–44)
AST: 70 U/L — ABNORMAL HIGH (ref 15–41)
Albumin: 3.6 g/dL (ref 3.5–5.0)
Alkaline Phosphatase: 120 U/L (ref 38–126)
Bilirubin, Direct: 0.6 mg/dL — ABNORMAL HIGH (ref 0.0–0.2)
Indirect Bilirubin: 1.4 mg/dL — ABNORMAL HIGH (ref 0.3–0.9)
Total Bilirubin: 2 mg/dL — ABNORMAL HIGH (ref 0.3–1.2)
Total Protein: 7.2 g/dL (ref 6.5–8.1)

## 2021-05-21 MED ORDER — ALUM & MAG HYDROXIDE-SIMETH 200-200-20 MG/5ML PO SUSP
30.0000 mL | Freq: Four times a day (QID) | ORAL | Status: DC | PRN
  Administered 2021-05-21 – 2021-05-23 (×2): 30 mL via ORAL
  Filled 2021-05-21 (×2): qty 30

## 2021-05-21 NOTE — Consult Note (Signed)
'@LOGO' @   Referring Provider: Sherrill Raring, PA-C Primary Care Physician:  [facility] Primary Gastroenterologist:  Dr. Jenetta Downer (previously unassigned)  Date of Admission: 05/20/21 Date of Consultation: 05/21/21  Reason for Consultation:  Nausea/vomiting  HPI:  Penny Garza is a 50 y.o. year old female with history of iron deficiency anemia thought to be secondary to menorrhagia s/p hysterectomy with bilateral salpingectomy on 05/01/2021 who presented to the emergency room for the second time in 5 days with complaints of nausea, vomiting, abdominal pain.  Prior evaluation in the ER on June 30th for the same with CT A/P at that time with no acute findings except for periumbilicl and umbilical hernia and small ascites.  She had an elevated white count at that time of 17.5, platelets 573, alk phos elevated at 153.  Otherwise, LFTs, T bili, lipase normal.  She was prescribed dicyclomine and Zofran for her symptoms and discharged home.  ED course: Hemodynamically stable and afebrile. Hemoglobin low at 10.4.  WBC returned to normal.  Slightly low potassium at 3.4.  Elevated AST at 84 and ALT at 80.  Alk phos 128.  T bili 1.8.  Lipase normal. DG chest and abdomen suspicious for small bowel obstruction.  No acute cardiopulmonary disease. CT A/P with contrast with early/mild small bowel obstruction with a transition point within the lower mid abdomen, small hiatal hernia, similar appearing couple small fat-containing ventral wall hernias.  Gallbladder and biliary tree unremarkable.  She was started on IV fluids, pain medications, and antiemetics. Made NPO. General surgery consulted.    Today:  Today, patient tells me that she had been doing very well after her hysterectomy until she developed acute onset nausea, vomiting, abdominal pain, and a "knot" in her stomach around her umbilicus on 4/69.  States since that time, she has not been able to keep anything down.  She has continued with nausea,  vomiting, and abdominal pain which starts at her umbilicus and radiates all over her abdomen.  Pain is constantly present to some degree, but worsens postprandially.  Last bowel movement was on 6/30 which was a little diarrhea.  She has history of intermittent constipation in the setting of oral iron which is well managed with MiraLAX as needed.  In general, she usually has bowel movement daily to twice daily.  She did pass gas yesterday, but has not passed any gas today.  Denies hematemesis, history of BRBPR or melena.  Has been taking ibuprofen 600 mg after her hysterectomy.  She is taking every 6 hours for a few days, but had reduced to a couple times a day.  She reports history [place] secondary to H. pylori which she reports was treated.  Also reports history of colon polyps with last colonoscopy around 2019 with the [place].  Reports history of reflux and had been on omeprazole as needed, but has not had to use this in quite some time.  No other abdominal surgeries aside from hysterectomy.  Drinks alcohol about twice a year.  Denies illicit drug use, or tobacco use.  Denies over-the-counter supplements aside from iron.  Denies herbal teas.  Denies any exposure to hepatitis.  No personal history of liver disease.  Her only blood transfusion was after her hysterectomy.  Reports father was an alcoholic.  Otherwise, no family history of liver disease.  Past Medical History:  Diagnosis Date   Anemia    Complication of anesthesia    Patient woke up during procedure  Past Surgical History:  Procedure Laterality Date   BILATERAL SALPINGECTOMY Bilateral 05/01/2021   Procedure: OPEN BILATERAL SALPINGECTOMY and OOPHORECTOMY;  Surgeon: Florian Buff, MD;  Location: [facility];  Service: Gynecology;  Laterality: Bilateral;   COLONOSCOPY     ENDOMETRIAL BIOPSY     IUD REMOVAL     SUPRACERVICAL ABDOMINAL HYSTERECTOMY N/A 05/01/2021   Procedure: HYSTERECTOMY SUPRACERVICAL ABDOMINAL;   Surgeon: Florian Buff, MD;  Location: [facility];  Service: Gynecology;  Laterality: N/A;    Prior to Admission medications   Medication Sig Start Date End Date Taking? Authorizing Provider  dicyclomine (BENTYL) 20 MG tablet Take 1 tablet (20 mg total) by mouth 3 (three) times daily as needed. 05/16/21  Yes Long, Wonda Olds, MD  ferrous sulfate 325 (65 FE) MG tablet Take 325 mg by mouth daily with breakfast.   Yes [provider]  ibuprofen (ADVIL) 600 MG tablet Take 1 tablet (600 mg total) by mouth every 6 (six) hours. 05/03/21  Yes Florian Buff, MD  ondansetron (ZOFRAN) 8 MG tablet Take 1 tablet (8 mg total) by mouth every 6 (six) hours as needed for nausea. 05/03/21  Yes Florian Buff, MD  oxyCODONE (OXY IR/ROXICODONE) 5 MG immediate release tablet Take 1-2 tablets (5-10 mg total) by mouth every 4 (four) hours as needed for moderate pain. Patient not taking: No sig reported 05/03/21   Florian Buff, MD    Current Facility-Administered Medications  Medication Dose Route Frequency Provider Last Rate Last Admin   dextrose 5 % and 0.9 % NaCl with KCl 20 mEq/L infusion   Intravenous Continuous Rise Patience, MD 100 mL/hr at 05/20/21 2317 New Bag at 05/20/21 2317   fentaNYL (SUBLIMAZE) injection 25 mcg  25 mcg Intravenous Q2H PRN Rise Patience, MD   25 mcg at 05/21/21 0429   fentaNYL (SUBLIMAZE) injection 50 mcg  50 mcg Intravenous Once Rise Patience, MD       hydrALAZINE (APRESOLINE) injection 5 mg  5 mg Intravenous Q4H PRN Rise Patience, MD       ondansetron [facility]) injection 4 mg  4 mg Intravenous Once Rise Patience, MD        Allergies as of 05/20/2021 - Review Complete 05/20/2021  Allergen Reaction Noted   Codeine Anaphylaxis 08/27/2020   Iron sucrose Rash 09/09/2019   Azithromycin  08/27/2020   Milk-related compounds Other (See Comments) 02/13/2021   Other Dermatitis 11/05/2006    Family History  Problem Relation Age of Onset   Diabetes  Paternal Grandfather    Diabetes Paternal Grandmother    Diabetes Maternal Grandfather    Diabetes Father    Hypertension Father    Heart attack Mother     Social History   Socioeconomic History   Marital status: Single    Spouse name: Not on file   Number of children: Not on file   Years of education: Not on file   Highest education level: Not on file  Occupational History   Not on file  Tobacco Use   Smoking status: Never   Smokeless tobacco: Never  Vaping Use   Vaping Use: Never used  Substance and Sexual Activity   Alcohol use: Not Currently   Drug use: Never   Sexual activity: Not Currently    Birth control/protection: Surgical    Comment: suprcervical hyst  Other Topics Concern   Not on file  Social History Narrative   Not on file   Social Determinants of  Health   Financial Resource Strain: Medium Risk   Difficulty of Paying Living Expenses: Somewhat hard  Food Insecurity: No Food Insecurity   Worried About Running Out of Food in the Last Year: Never true   Ran Out of Food in the Last Year: Never true  Transportation Needs: Unmet Transportation Needs   Lack of Transportation (Medical): Yes   Lack of Transportation (Non-Medical): Yes  Physical Activity: Insufficiently Active   Days of Exercise per Week: 2 days   Minutes of Exercise per Session: 30 min  Stress: No Stress Concern Present   Feeling of Stress : Only a little  Social Connections: Moderately Isolated   Frequency of Communication with Friends and Family: More than three times a week   Frequency of Social Gatherings with Friends and Family: Twice a week   Attends Religious Services: More than 4 times per year   Active Member of [employer] or Organizations: No   Attends Music therapist: Never   Marital Status: Never married  Human resources officer Violence: Not At Risk   Fear of Current or Ex-Partner: No   Emotionally Abused: No   Physically Abused: No   Sexually Abused: No    Review of  Systems: Gen: Denies fever, chills, cold or flulike symptoms. CV: Denies chest pain, heart palpitations Resp: Denies shortness of breath or cough. GI: See HPI GU : Denies urinary burning, urinary frequency, urinary incontinence.  MS: Denies joint pain Derm: Denies rash Psych: Denies depression, anxiety,confusion, or memory loss Heme: Denies bruising, bleeding, and enlarged lymph nodes.  Physical Exam: Vital signs in last 24 hours: Temp:  [98 F (36.7 C)-99 F (37.2 C)] 98 F (36.7 C) (07/05 0313) Pulse Rate:  [80-92] 87 (07/05 0313) Resp:  [18-21] 18 (07/05 0313) BP: (139-163)/(64-95) 163/95 (07/05 0313) SpO2:  [94 %-100 %] 100 % (07/05 0313) FiO2 (%):  [21 %] 21 % (07/04 2058)   General:   Alert,  Well-developed, well-nourished, pleasant and cooperative in NAD Head:  Normocephalic and atraumatic. Eyes:  Sclera clear, no icterus.   Conjunctiva pink. Ears:  Normal auditory acuity. Nose:  No deformity, discharge,  or lesions. Lungs:  Clear throughout to auscultation.   No wheezes, crackles, or rhonchi. No acute distress. Heart:  Regular rate and rhythm; no murmurs, clicks, rubs,  or gallops. Abdomen: Hypoactive bowel sounds.  Abdomen is full, obese, but soft.  Mild generalized tenderness to outpatient, greatest in the periumbilical and epigastric area.  Soft reducible umbilical hernia though she is tender in this area.  No guarding or rebound. Rectal:  Deferred Msk:  Symmetrical without gross deformities. Normal posture. Extremities:  Without edema. Neurologic:  Alert and  oriented x4;  grossly normal neurologically. Skin:  Intact without significant lesions or rashes. Psych: Normal mood and affect.  Intake/Output from previous day: 07/04 0701 - 07/05 0700 In: 670.4 [I.V.:670.4] Out: -  Intake/Output this shift: No intake/output data recorded.  Lab Results: Recent Labs    05/20/21 1652  WBC 5.5  HGB 10.4*  HCT 34.8*  PLT 405*   BMET Recent Labs    05/20/21 1652   NA 141  K 3.4*  CL 105  CO2 25  GLUCOSE 95  BUN 9  CREATININE 0.75  CALCIUM 9.5   LFT Recent Labs    05/20/21 1652  PROT 8.4*  ALBUMIN 4.0  AST 84*  ALT 80*  ALKPHOS 128*  BILITOT 1.8*   Studies/Results: CT ABDOMEN PELVIS W CONTRAST  Result Date: 05/20/2021 CLINICAL DATA:  Nonlocalized acute abdominal pain. Total abdominal hysterectomy 05/01/2021 EXAM: CT ABDOMEN AND PELVIS WITH CONTRAST TECHNIQUE: Multidetector CT imaging of the abdomen and pelvis was performed using the standard protocol following bolus administration of intravenous contrast. CONTRAST:  133m OMNIPAQUE IOHEXOL 300 MG/ML  SOLN COMPARISON:  CT abdomen pelvis 05/16/2021 FINDINGS: Lower chest: Normal in size without focal abnormality. Tiny hiatal hernia. Hepatobiliary: No focal liver abnormality. No gallstones, gallbladder wall thickening, or pericholecystic fluid. No biliary dilatation. Pancreas: No focal lesion. Normal pancreatic contour. No surrounding inflammatory changes. No main pancreatic ductal dilatation. Spleen: Normal in size without focal abnormality. Adrenals/Urinary Tract: No adrenal nodule bilaterally. Bilateral kidneys enhance symmetrically. 1 mm calcified stone within the right kidney. No left nephrolithiasis. No hydronephrosis. No hydroureter. The urinary bladder is unremarkable. On delayed imaging, there is no urothelial wall thickening and there are no filling defects in the opacified portions of the bilateral collecting systems or ureters. Stomach/Bowel: Intraluminal contrast noted within the transverse colon, left colon, sigmoid colon rectum. Stomach is within normal limits. Several loops of small bowel within the lower abdomen and pelvis are mildly dilated with fluid measuring up to 3.3 cm. The distal small bowel distal to this is collapsed. No pneumatosis. No evidence of large bowel wall thickening or dilatation. Appendix appears normal. Appendicoliths noted within the lumen of the appendix.  Vascular/Lymphatic: No abdominal aorta or iliac aneurysm. Mild atherosclerotic plaque of the aorta and its branches. No abdominal, pelvic, or inguinal lymphadenopathy. Reproductive: Status post hysterectomy and bilateral oophorectomy. Similar to prior. Other: Interval resolution of trace pelvic free fluid. No intraperitoneal free gas. No organized fluid collection. Musculoskeletal: Stable small fat containing umbilical hernia. Stable small fat and free fluid containing supraumbilical ventral wall hernia (6:85-89). No suspicious lytic or blastic osseous lesions. No acute displaced fracture. Multilevel degenerative changes of the spine. IMPRESSION: 1. Early/mild small-bowel obstruction with a transition point within the lower mid abdomen in a patient status post hysterectomy and bilateral salpingo oophorectomy. No CT findings of bowel obstruction or ischemia. 2. Small hiatal hernia. 3. Similar-appearing couple small fat containing ventral wall hernias. Electronically Signed   By: MIven FinnM.D.   On: 05/20/2021 22:53   DG ABD ACUTE 2+V W 1V CHEST  Result Date: 05/20/2021 CLINICAL DATA:  Abdominal cramping with nausea EXAM: DG ABDOMEN ACUTE WITH 1 VIEW CHEST COMPARISON:  CT 05/16/2021 FINDINGS: Single-view chest demonstrates no focal opacity or pleural effusion. Normal cardiomediastinal silhouette. Supine and upright views of the abdomen demonstrate no free air beneath the diaphragm. Multiple dilated loops of small bowel measuring up to 4.4 cm with multiple fluid levels on upright exam and relative paucity of distal gas could concerning for a bowel obstruction. IMPRESSION: Findings suspicious for small bowel obstruction. No acute cardiopulmonary disease. Electronically Signed   By: KDonavan FoilM.D.   On: 05/20/2021 20:07    Impression: 50y.o. year old female with history of iron deficiency anemia thought to be secondary to menorrhagia s/p hysterectomy with bilateral salpingectomy on 05/01/2021 who  presented to the emergency room with ongoing nausea, vomiting, and abdominal pain x5 days.  She was found to have acute mild elevation of AST and ALT at 84 and 80 respectively, mildly elevated alk phos at 128, T bili 1.8, lipase normal.  Potassium low at 3.4.  CT A/P with contrast with early/mild small bowel obstruction with transition point within the lower mid abdomen, small hiatal hernia, small fat-containing ventral hernias, gallbladder and biliary tree unremarkable.  Nausea/vomiting/abdominal pain: Acute onset N/V/periumbilical  abdominal pain that radiates across entire abdomen starting on 6/30 with persistent symptoms, worsened postprandially. Associated obstipation x 5 days and decreased flatus. Likely secondary to SBO. Less likely that small fat containing hernias are contributing to her symptoms. Can't rule out gallbladder etiology in the setting of elevated LFTs/bilirubin though her CT was reassuring. LFTs are down trending today with AST 70, ALT 73, T bili 2.0 with direct bilirubin 0.6 and indirect 1.4.  Acute elevation may be secondary to acute illness, and she may have component of Gilbert's syndrome contributing to elevated bilirubin.  Could also have component of gastritis, duodenitis, or PUD as she is tender in the epigastric area and reports history of PUD with H. pylori in the past. Also taking taking ibuprofen since her hysterectomy on 6/15. Denies brbpr or melena.    Will order RUQ ultrasound to further evaluate elevated LFTs/bilirubin.  Will defer management of SBO to general surgery who is also on board.  She will need correction of electrolyte abnormalities.  If SBO resolves and she continues with abdominal pain, nausea, vomiting, could consider EGD if [place] is unrevealing.    Anemia: History of IDA in the setting of menorrhagia.  Hemoglobin 10.4, down from 12.7 on 6/30 though hemoglobin of 10.4 is markedly improved from hemoglobin of 8.4 on 6/27 in is more consistent with her baseline  prior to hysterectomy.  Denies BRBPR or melena.  Hemoglobin 9.4 this morning, likely secondary to hemodilution.  Continue to monitor.  Plan: RUQ ultrasound. Management of SBO per general surgery. Continue to follow LFTs/bilirubin daily. Correction of electrolyte abnormalities per hospitalist. Continue to follow H/H. Continue supportive measures. Avoid NASIDs. She will need testing to verify H. Pylori eradication at some point in the future.     LOS: 1 day    05/21/2021, 7:37 AM   Aliene Altes, PA-C [facility]

## 2021-05-21 NOTE — Plan of Care (Signed)
  Problem: Education: Goal: Knowledge of General Education information will improve Description: Including pain rating scale, medication(s)/side effects and non-pharmacologic comfort measures Outcome: Progressing   Problem: Clinical Measurements: Goal: Will remain free from infection Outcome: Progressing Goal: Diagnostic test results will improve Outcome: Progressing   

## 2021-05-21 NOTE — Consult Note (Signed)
[facility] Consult  Reason for Consult:Abdominal Pain/ Small Bowel Obstruction Referring Physician: Rodena Goldmann, DO  Chief Complaint   Abdominal Pain     HPI: Penny Garza is a 50 y.o. female with Nausea and abdominal pain.  The pain started on 6/30 and has had a duration of 6 days.  It is associated with vomiting and abdominal tenderness. Patient notes that she gets two hard painful lumps in the umbilical and super umbilical regions on occasion that are painful to touch. She has elevated LFTs on 7/4 and cloudy urine notable for a small amount of hemoglobin, ketones and protein. Past Medical History:  Diagnosis Date   Anemia    Complication of anesthesia    Patient woke up during procedure    Past Surgical History:  Procedure Laterality Date   BILATERAL SALPINGECTOMY Bilateral 05/01/2021   Procedure: OPEN BILATERAL SALPINGECTOMY and OOPHORECTOMY;  Surgeon: Florian Buff, MD;  Location: [facility];  Service: Gynecology;  Laterality: Bilateral;   COLONOSCOPY     ENDOMETRIAL BIOPSY     IUD REMOVAL     SUPRACERVICAL ABDOMINAL HYSTERECTOMY N/A 05/01/2021   Procedure: HYSTERECTOMY SUPRACERVICAL ABDOMINAL;  Surgeon: Florian Buff, MD;  Location: [facility];  Service: Gynecology;  Laterality: N/A;    Family History  Problem Relation Age of Onset   Diabetes Paternal Grandfather    Diabetes Paternal Grandmother    Diabetes Maternal Grandfather    Diabetes Father    Hypertension Father    Heart attack Mother     Social History   Tobacco Use   Smoking status: Never   Smokeless tobacco: Never  Vaping Use   Vaping Use: Never used  Substance Use Topics   Alcohol use: Not Currently   Drug use: Never    Medications: I have reviewed the patient's current medications.  Allergies  Allergen Reactions   Codeine Anaphylaxis   Iron Sucrose Rash   Azithromycin    Milk-Related Compounds Other (See Comments)    Stomach pain   Other Dermatitis    Certain metals       ROS:  Constitutional: negative Eyes: negative Ears, nose, mouth, throat, and face: negative Respiratory: negative Cardiovascular: negative Gastrointestinal: positive for abdominal pain, change in bowel habits, diarrhea, nausea, reflux symptoms, and vomiting  Blood pressure 128/61, pulse 81, temperature 98.2 F (36.8 C), temperature source Oral, resp. rate 16, last menstrual period 02/27/2021, SpO2 96 %. Physical Exam HENT:     Head: Normocephalic and atraumatic.  Eyes:     Extraocular Movements: Extraocular movements intact.  Cardiovascular:     Rate and Rhythm: Normal rate.  Pulmonary:     Effort: Pulmonary effort is normal.     Breath sounds: Normal breath sounds.  Abdominal:     General: A surgical scar is present.     Palpations: Abdomen is soft.     Tenderness: There is generalized abdominal tenderness and tenderness in the periumbilical area.     Hernia: A hernia is present. Hernia is present in the umbilical area.  Skin:    General: Skin is warm and dry.  Neurological:     Mental Status: She is alert.  Psychiatric:        Mood and Affect: Mood normal.        Behavior: Behavior normal.    Results: Results for orders placed or performed during the hospital encounter of 05/20/21 (from the past 48 hour(s))  CBC with Differential     Status: Abnormal  Collection Time: 05/20/21  4:52 PM  Result Value Ref Range   WBC 5.5 4.0 - 10.5 K/uL   RBC 3.71 (L) 3.87 - 5.11 MIL/uL   Hemoglobin 10.4 (L) 12.0 - 15.0 g/dL   HCT 34.8 (L) 36.0 - 46.0 %   MCV 93.8 80.0 - 100.0 fL   MCH 28.0 26.0 - 34.0 pg   MCHC 29.9 (L) 30.0 - 36.0 g/dL   RDW 15.8 (H) 11.5 - 15.5 %   Platelets 405 (H) 150 - 400 K/uL   nRBC 0.0 0.0 - 0.2 %   Neutrophils Relative % 69 %   Neutro Abs 3.8 1.7 - 7.7 K/uL   Lymphocytes Relative 21 %   Lymphs Abs 1.2 0.7 - 4.0 K/uL   Monocytes Relative 9 %   Monocytes Absolute 0.5 0.1 - 1.0 K/uL   Eosinophils Relative 1 %   Eosinophils Absolute 0.0 0.0 - 0.5  K/uL   Basophils Relative 0 %   Basophils Absolute 0.0 0.0 - 0.1 K/uL   Immature Granulocytes 0 %   Abs Immature Granulocytes 0.02 0.00 - 0.07 K/uL    Comment: Performed at [facility], [address] [place], [place] [address]  Comprehensive metabolic panel     Status: Abnormal   Collection Time: 05/20/21  4:52 PM  Result Value Ref Range   Sodium 141 135 - 145 mmol/L   Potassium 3.4 (L) 3.5 - 5.1 mmol/L   Chloride 105 98 - 111 mmol/L   CO2 25 22 - 32 mmol/L   Glucose, Bld 95 70 - 99 mg/dL    Comment: Glucose reference range applies only to samples taken after fasting for at least 8 hours.   BUN 9 6 - 20 mg/dL   Creatinine, Ser 0.75 0.44 - 1.00 mg/dL   Calcium 9.5 8.9 - 10.3 mg/dL   Total Protein 8.4 (H) 6.5 - 8.1 g/dL   Albumin 4.0 3.5 - 5.0 g/dL   AST 84 (H) 15 - 41 U/L   ALT 80 (H) 0 - 44 U/L   Alkaline Phosphatase 128 (H) 38 - 126 U/L   Total Bilirubin 1.8 (H) 0.3 - 1.2 mg/dL   GFR, Estimated >60 >60 mL/min    Comment: (NOTE) Calculated using the CKD-EPI Creatinine Equation (2021)    Anion gap 11 5 - 15    Comment: Performed at [facility], [address], [place], [place] [address]  Lipase, blood     Status: None   Collection Time: 05/20/21  4:52 PM  Result Value Ref Range   Lipase 20 11 - 51 U/L    Comment: Performed at [facility], [address], [place], [place] [address]  Urinalysis, Routine w reflex microscopic Urine, Clean Catch     Status: Abnormal   Collection Time: 05/20/21  5:27 PM  Result Value Ref Range   Color, Urine YELLOW YELLOW   APPearance CLOUDY (A) CLEAR   Specific Gravity, Urine 1.021 1.005 - 1.030   pH 7.0 5.0 - 8.0   Glucose, UA NEGATIVE NEGATIVE mg/dL   Hgb urine dipstick SMALL (A) NEGATIVE   Bilirubin Urine NEGATIVE NEGATIVE   Ketones, ur 80 (A) NEGATIVE mg/dL   Protein, ur 30 (A) NEGATIVE mg/dL   Nitrite NEGATIVE NEGATIVE   Leukocytes,Ua NEGATIVE NEGATIVE   RBC / HPF 0-5 0 - 5 RBC/hpf   WBC, UA 6-10 0 - 5 WBC/hpf   Bacteria, UA RARE  (A) NONE SEEN   Squamous Epithelial / LPF 11-20 0 - 5  Mucus PRESENT     Comment: Performed at [facility], [address] [place], [place] [address]  Resp Panel by RT-PCR (Flu A&B, Covid) Nasopharyngeal Swab     Status: None   Collection Time: 05/20/21 10:34 PM   Specimen: Nasopharyngeal Swab; Nasopharyngeal(NP) swabs in vial transport medium  Result Value Ref Range   SARS Coronavirus 2 by RT PCR NEGATIVE NEGATIVE    Comment: (NOTE) SARS-CoV-2 target nucleic acids are NOT DETECTED.  The SARS-CoV-2 RNA is generally detectable in upper respiratory specimens during the acute phase of infection. The lowest concentration of SARS-CoV-2 viral copies this assay can detect is 138 copies/mL. A negative result does not preclude SARS-Cov-2 infection and should not be used as the sole basis for treatment or other patient management decisions. A negative result may occur with  improper specimen collection/handling, submission of specimen other than nasopharyngeal swab, presence of viral mutation(s) within the areas targeted by this assay, and inadequate number of viral copies(<138 copies/mL). A negative result must be combined with clinical observations, patient history, and epidemiological information. The expected result is Negative.  Fact Sheet for Patients:  EntrepreneurPulse.com.au  Fact Sheet for Healthcare Providers:  IncredibleEmployment.be  This test is no t yet approved or cleared by the [place] FDA and  has been authorized for detection and/or diagnosis of SARS-CoV-2 by FDA under an Emergency Use Authorization (EUA). This EUA will remain  in effect (meaning this test can be used) for the duration of the COVID-19 declaration under Section 564(b)(1) of the Act, 21 U.S.C.section 360bbb-3(b)(1), unless the authorization is terminated  or revoked sooner.       Influenza A by PCR NEGATIVE NEGATIVE   Influenza B by PCR NEGATIVE NEGATIVE     Comment: (NOTE) The Xpert Xpress SARS-CoV-2/FLU/RSV plus assay is intended as an aid in the diagnosis of influenza from Nasopharyngeal swab specimens and should not be used as a sole basis for treatment. Nasal washings and aspirates are unacceptable for Xpert Xpress SARS-CoV-2/FLU/RSV testing.  Fact Sheet for Patients: EntrepreneurPulse.com.au  Fact Sheet for Healthcare Providers: IncredibleEmployment.be  This test is not yet approved or cleared by the [place] FDA and has been authorized for detection and/or diagnosis of SARS-CoV-2 by FDA under an Emergency Use Authorization (EUA). This EUA will remain in effect (meaning this test can be used) for the duration of the COVID-19 declaration under Section 564(b)(1) of the Act, 21 U.S.C. section 360bbb-3(b)(1), unless the authorization is terminated or revoked.  Performed at [facility], [address], [place], [place] [address]   Hepatic function panel     Status: Abnormal   Collection Time: 05/21/21  6:45 AM  Result Value Ref Range   Total Protein 7.2 6.5 - 8.1 g/dL   Albumin 3.6 3.5 - 5.0 g/dL   AST 70 (H) 15 - 41 U/L   ALT 73 (H) 0 - 44 U/L   Alkaline Phosphatase 120 38 - 126 U/L   Total Bilirubin 2.0 (H) 0.3 - 1.2 mg/dL   Bilirubin, Direct 0.6 (H) 0.0 - 0.2 mg/dL   Indirect Bilirubin 1.4 (H) 0.3 - 0.9 mg/dL    Comment: Performed at [facility], [address], [place], [place] [address]  CBC WITH DIFFERENTIAL     Status: Abnormal   Collection Time: 05/21/21  6:45 AM  Result Value Ref Range   WBC 4.5 4.0 - 10.5 K/uL   RBC 3.38 (L) 3.87 - 5.11 MIL/uL   Hemoglobin 9.4 (L) 12.0 - 15.0 g/dL  HCT 31.9 (L) 36.0 - 46.0 %   MCV 94.4 80.0 - 100.0 fL   MCH 27.8 26.0 - 34.0 pg   MCHC 29.5 (L) 30.0 - 36.0 g/dL   RDW 16.0 (H) 11.5 - 15.5 %   Platelets 364 150 - 400 K/uL   nRBC 0.0 0.0 - 0.2 %   Neutrophils Relative % 60 %   Neutro Abs 2.7 1.7 - 7.7 K/uL   Lymphocytes Relative 28 %    Lymphs Abs 1.3 0.7 - 4.0 K/uL   Monocytes Relative 11 %   Monocytes Absolute 0.5 0.1 - 1.0 K/uL   Eosinophils Relative 1 %   Eosinophils Absolute 0.0 0.0 - 0.5 K/uL   Basophils Relative 0 %   Basophils Absolute 0.0 0.0 - 0.1 K/uL   Immature Granulocytes 0 %   Abs Immature Granulocytes 0.01 0.00 - 0.07 K/uL    Comment: Performed at [facility], [address] [place], [place] [address]  Basic metabolic panel     Status: Abnormal   Collection Time: 05/21/21  6:45 AM  Result Value Ref Range   Sodium 140 135 - 145 mmol/L   Potassium 3.2 (L) 3.5 - 5.1 mmol/L   Chloride 107 98 - 111 mmol/L   CO2 25 22 - 32 mmol/L   Glucose, Bld 110 (H) 70 - 99 mg/dL    Comment: Glucose reference range applies only to samples taken after fasting for at least 8 hours.   BUN 8 6 - 20 mg/dL   Creatinine, Ser 0.63 0.44 - 1.00 mg/dL   Calcium 8.9 8.9 - 10.3 mg/dL   GFR, Estimated >60 >60 mL/min    Comment: (NOTE) Calculated using the CKD-EPI Creatinine Equation (2021)    Anion gap 8 5 - 15    Comment: Performed at [facility], [address], [place], [place] [address]    CT ABDOMEN PELVIS W CONTRAST  Result Date: 05/20/2021 CLINICAL DATA:  Nonlocalized acute abdominal pain. Total abdominal hysterectomy 05/01/2021 EXAM: CT ABDOMEN AND PELVIS WITH CONTRAST TECHNIQUE: Multidetector CT imaging of the abdomen and pelvis was performed using the standard protocol following bolus administration of intravenous contrast. CONTRAST:  179mL OMNIPAQUE IOHEXOL 300 MG/ML  SOLN COMPARISON:  CT abdomen pelvis 05/16/2021 FINDINGS: Lower chest: Normal in size without focal abnormality. Tiny hiatal hernia. Hepatobiliary: No focal liver abnormality. No gallstones, gallbladder wall thickening, or pericholecystic fluid. No biliary dilatation. Pancreas: No focal lesion. Normal pancreatic contour. No surrounding inflammatory changes. No main pancreatic ductal dilatation. Spleen: Normal in size without focal abnormality. Adrenals/Urinary  Tract: No adrenal nodule bilaterally. Bilateral kidneys enhance symmetrically. 1 mm calcified stone within the right kidney. No left nephrolithiasis. No hydronephrosis. No hydroureter. The urinary bladder is unremarkable. On delayed imaging, there is no urothelial wall thickening and there are no filling defects in the opacified portions of the bilateral collecting systems or ureters. Stomach/Bowel: Intraluminal contrast noted within the transverse colon, left colon, sigmoid colon rectum. Stomach is within normal limits. Several loops of small bowel within the lower abdomen and pelvis are mildly dilated with fluid measuring up to 3.3 cm. The distal small bowel distal to this is collapsed. No pneumatosis. No evidence of large bowel wall thickening or dilatation. Appendix appears normal. Appendicoliths noted within the lumen of the appendix. Vascular/Lymphatic: No abdominal aorta or iliac aneurysm. Mild atherosclerotic plaque of the aorta and its branches. No abdominal, pelvic, or inguinal lymphadenopathy. Reproductive: Status post hysterectomy and bilateral oophorectomy. Similar to prior. Other: Interval resolution of trace pelvic  free fluid. No intraperitoneal free gas. No organized fluid collection. Musculoskeletal: Stable small fat containing umbilical hernia. Stable small fat and free fluid containing supraumbilical ventral wall hernia (6:85-89). No suspicious lytic or blastic osseous lesions. No acute displaced fracture. Multilevel degenerative changes of the spine. IMPRESSION: 1. Early/mild small-bowel obstruction with a transition point within the lower mid abdomen in a patient status post hysterectomy and bilateral salpingo oophorectomy. No CT findings of bowel obstruction or ischemia. 2. Small hiatal hernia. 3. Similar-appearing couple small fat containing ventral wall hernias. Electronically Signed   By: Iven Finn M.D.   On: 05/20/2021 22:53   DG ABD ACUTE 2+V W 1V CHEST  Result Date:  05/20/2021 CLINICAL DATA:  Abdominal cramping with nausea EXAM: DG ABDOMEN ACUTE WITH 1 VIEW CHEST COMPARISON:  CT 05/16/2021 FINDINGS: Single-view chest demonstrates no focal opacity or pleural effusion. Normal cardiomediastinal silhouette. Supine and upright views of the abdomen demonstrate no free air beneath the diaphragm. Multiple dilated loops of small bowel measuring up to 4.4 cm with multiple fluid levels on upright exam and relative paucity of distal gas could concerning for a bowel obstruction. IMPRESSION: Findings suspicious for small bowel obstruction. No acute cardiopulmonary disease. Electronically Signed   By: Donavan Foil M.D.   On: 05/20/2021 20:07     Assessment & Plan:  Penny Garza is a 50 y.o. female with a past medical history pertinent for a hysterectomy on 6/15, anemia and hernias in the umbilical region who presents with a 6 day history of nausea, vomiting and diffuse abdominal tenderness. CT on 7/4 showed a potential small bowel obstruction and an umbilical hernia and a periumbilical hernia. Her hernias harden and are tender on occasion. Due to acute hardening and pain of hernias around the time of initial presentation, pain is likely due to intermittent hernia incarceration. We will schedule a hernia repair to address this issue.    Penny Garza 05/21/2021, 8:53 AM

## 2021-05-21 NOTE — Progress Notes (Signed)
PROGRESS NOTE    Penny Garza  GEX:[id number] DOB: Dec 14, 1970 DOA: 05/20/2021 PCP: [facility]   Brief Narrative:   Penny Garza is a 50 y.o. female with no significant past medical history presents to the ER for the second time in 5 days with complaints of nausea vomiting abdominal discomfort.  Patient was admitted with intractable nausea and vomiting with possible partial small bowel obstruction.  She is also noted to have some mild transaminitis that is being investigated.  Assessment & Plan:   Principal Problem:   Nausea & vomiting Active Problems:   S/P TAH-BSO (total abdominal hysterectomy and bilateral salpingo-oophorectomy)   Abdominal pain   Elevated LFTs   Intractable nausea and vomiting with possible SBO -Noted to have ongoing abdominal pain with nausea and vomiting this morning and no BM -Appreciate general surgery recommendations -Appreciate gynecology recommendations -Continue IV fluid -N.p.o. -Antiemetics  Mild transaminitis -Continue to follow -Appreciate GI recommendations with abdominal ultrasound ordered  Hypokalemia -Continue ongoing IV fluid supplementation  Recent total abdominal hysterectomy -Appreciate gynecology recommendations  Anemia -Hemoglobin downtrending -No overt bleeding identified -Continue to monitor  Morbid obesity -Lifestyle changes outpatient   DVT prophylaxis: SCDs Code Status: Full Family Communication: None at bedside Disposition Plan:  Status is: Inpatient  Remains inpatient appropriate because:IV treatments appropriate due to intensity of illness or inability to take PO and Inpatient level of care appropriate due to severity of illness  Dispo: The patient is from: Home              Anticipated d/c is to: Home              Patient currently is not medically stable to d/c.   Difficult to place patient No   Consultants:  General surgery GI Gynecology  Procedures:  See below  Antimicrobials:   None   Subjective: Patient seen and evaluated today with ongoing diffuse abdominal pain with nausea and vomiting noted this morning.  No bowel movement noted as of yet.  Objective: Vitals:   05/20/21 2058 05/21/21 0013 05/21/21 0313 05/21/21 0821  BP:  139/64 (!) 163/95 128/61  Pulse:  83 87 81  Resp:  18 18 16   Temp:  98.4 F (36.9 C) 98 F (36.7 C) 98.2 F (36.8 C)  TempSrc:  Oral Oral Oral  SpO2: 94% 100% 100% 96%    Intake/Output Summary (Last 24 hours) at 05/21/2021 1145 Last data filed at 05/21/2021 0900 Gross per 24 hour  Intake 670.43 ml  Output --  Net 670.43 ml   There were no vitals filed for this visit.  Examination:  General exam: Appears calm and comfortable, obese Respiratory system: Clear to auscultation. Respiratory effort normal. Cardiovascular system: S1 & S2 heard, RRR.  Gastrointestinal system: Abdomen is soft, mild diffuse tenderness to palpation Central nervous system: Alert and awake Extremities: No edema Skin: No significant lesions noted Psychiatry: Flat affect.    Data Reviewed: I have personally reviewed following labs and imaging studies  CBC: Recent Labs  Lab 05/16/21 0911 05/20/21 1652 05/21/21 0645  WBC 17.5* 5.5 4.5  NEUTROABS 13.7* 3.8 2.7  HGB 12.7 10.4* 9.4*  HCT 42.0 34.8* 31.9*  MCV 95.5 93.8 94.4  PLT 573* 405* 010   Basic Metabolic Panel: Recent Labs  Lab 05/16/21 0911 05/20/21 1652 05/21/21 0645  NA 136 141 140  K 3.7 3.4* 3.2*  CL 101 105 107  CO2 19* 25 25  GLUCOSE 160* 95 110*  BUN 15 9 8   CREATININE  0.82 0.75 0.63  CALCIUM 10.2 9.5 8.9   GFR: Estimated Creatinine Clearance: 114.6 mL/min (by C-G formula based on SCr of 0.63 mg/dL). Liver Function Tests: Recent Labs  Lab 05/16/21 0911 05/20/21 1652 05/21/21 0645  AST 32 84* 70*  ALT 34 80* 73*  ALKPHOS 153* 128* 120  BILITOT 1.1 1.8* 2.0*  PROT 8.9* 8.4* 7.2  ALBUMIN 4.3 4.0 3.6   Recent Labs  Lab 05/16/21 0911 05/20/21 1652  LIPASE 23  20   No results for input(s): AMMONIA in the last 168 hours. Coagulation Profile: No results for input(s): INR, PROTIME in the last 168 hours. Cardiac Enzymes: No results for input(s): CKTOTAL, CKMB, CKMBINDEX, TROPONINI in the last 168 hours. BNP (last 3 results) No results for input(s): PROBNP in the last 8760 hours. HbA1C: No results for input(s): HGBA1C in the last 72 hours. CBG: Recent Labs  Lab 05/16/21 0833  GLUCAP 175*   Lipid Profile: No results for input(s): CHOL, HDL, LDLCALC, TRIG, CHOLHDL, LDLDIRECT in the last 72 hours. Thyroid Function Tests: No results for input(s): TSH, T4TOTAL, FREET4, T3FREE, THYROIDAB in the last 72 hours. Anemia Panel: No results for input(s): VITAMINB12, FOLATE, FERRITIN, TIBC, IRON, RETICCTPCT in the last 72 hours. Sepsis Labs: No results for input(s): PROCALCITON, LATICACIDVEN in the last 168 hours.  Recent Results (from the past 240 hour(s))  Resp Panel by RT-PCR (Flu A&B, Covid) Nasopharyngeal Swab     Status: None   Collection Time: 05/20/21 10:34 PM   Specimen: Nasopharyngeal Swab; Nasopharyngeal(NP) swabs in vial transport medium  Result Value Ref Range Status   SARS Coronavirus 2 by RT PCR NEGATIVE NEGATIVE Final    Comment: (NOTE) SARS-CoV-2 target nucleic acids are NOT DETECTED.  The SARS-CoV-2 RNA is generally detectable in upper respiratory specimens during the acute phase of infection. The lowest concentration of SARS-CoV-2 viral copies this assay can detect is 138 copies/mL. A negative result does not preclude SARS-Cov-2 infection and should not be used as the sole basis for treatment or other patient management decisions. A negative result may occur with  improper specimen collection/handling, submission of specimen other than nasopharyngeal swab, presence of viral mutation(s) within the areas targeted by this assay, and inadequate number of viral copies(<138 copies/mL). A negative result must be combined with clinical  observations, patient history, and epidemiological information. The expected result is Negative.  Fact Sheet for Patients:  EntrepreneurPulse.com.au  Fact Sheet for Healthcare Providers:  IncredibleEmployment.be  This test is no t yet approved or cleared by the [place] FDA and  has been authorized for detection and/or diagnosis of SARS-CoV-2 by FDA under an Emergency Use Authorization (EUA). This EUA will remain  in effect (meaning this test can be used) for the duration of the COVID-19 declaration under Section 564(b)(1) of the Act, 21 U.S.C.section 360bbb-3(b)(1), unless the authorization is terminated  or revoked sooner.       Influenza A by PCR NEGATIVE NEGATIVE Final   Influenza B by PCR NEGATIVE NEGATIVE Final    Comment: (NOTE) The Xpert Xpress SARS-CoV-2/FLU/RSV plus assay is intended as an aid in the diagnosis of influenza from Nasopharyngeal swab specimens and should not be used as a sole basis for treatment. Nasal washings and aspirates are unacceptable for Xpert Xpress SARS-CoV-2/FLU/RSV testing.  Fact Sheet for Patients: EntrepreneurPulse.com.au  Fact Sheet for Healthcare Providers: IncredibleEmployment.be  This test is not yet approved or cleared by the [place] FDA and has been authorized for detection and/or diagnosis of SARS-CoV-2 by FDA  under an Emergency Use Authorization (EUA). This EUA will remain in effect (meaning this test can be used) for the duration of the COVID-19 declaration under Section 564(b)(1) of the Act, 21 U.S.C. section 360bbb-3(b)(1), unless the authorization is terminated or revoked.  Performed at [facility], [address] [place], [place] [address]          Radiology Studies: CT ABDOMEN PELVIS W CONTRAST  Result Date: 05/20/2021 CLINICAL DATA:  Nonlocalized acute abdominal pain. Total abdominal hysterectomy 05/01/2021 EXAM: CT ABDOMEN AND  PELVIS WITH CONTRAST TECHNIQUE: Multidetector CT imaging of the abdomen and pelvis was performed using the standard protocol following bolus administration of intravenous contrast. CONTRAST:  131mL OMNIPAQUE IOHEXOL 300 MG/ML  SOLN COMPARISON:  CT abdomen pelvis 05/16/2021 FINDINGS: Lower chest: Normal in size without focal abnormality. Tiny hiatal hernia. Hepatobiliary: No focal liver abnormality. No gallstones, gallbladder wall thickening, or pericholecystic fluid. No biliary dilatation. Pancreas: No focal lesion. Normal pancreatic contour. No surrounding inflammatory changes. No main pancreatic ductal dilatation. Spleen: Normal in size without focal abnormality. Adrenals/Urinary Tract: No adrenal nodule bilaterally. Bilateral kidneys enhance symmetrically. 1 mm calcified stone within the right kidney. No left nephrolithiasis. No hydronephrosis. No hydroureter. The urinary bladder is unremarkable. On delayed imaging, there is no urothelial wall thickening and there are no filling defects in the opacified portions of the bilateral collecting systems or ureters. Stomach/Bowel: Intraluminal contrast noted within the transverse colon, left colon, sigmoid colon rectum. Stomach is within normal limits. Several loops of small bowel within the lower abdomen and pelvis are mildly dilated with fluid measuring up to 3.3 cm. The distal small bowel distal to this is collapsed. No pneumatosis. No evidence of large bowel wall thickening or dilatation. Appendix appears normal. Appendicoliths noted within the lumen of the appendix. Vascular/Lymphatic: No abdominal aorta or iliac aneurysm. Mild atherosclerotic plaque of the aorta and its branches. No abdominal, pelvic, or inguinal lymphadenopathy. Reproductive: Status post hysterectomy and bilateral oophorectomy. Similar to prior. Other: Interval resolution of trace pelvic free fluid. No intraperitoneal free gas. No organized fluid collection. Musculoskeletal: Stable small fat  containing umbilical hernia. Stable small fat and free fluid containing supraumbilical ventral wall hernia (6:85-89). No suspicious lytic or blastic osseous lesions. No acute displaced fracture. Multilevel degenerative changes of the spine. IMPRESSION: 1. Early/mild small-bowel obstruction with a transition point within the lower mid abdomen in a patient status post hysterectomy and bilateral salpingo oophorectomy. No CT findings of bowel obstruction or ischemia. 2. Small hiatal hernia. 3. Similar-appearing couple small fat containing ventral wall hernias. Electronically Signed   By: Iven Finn M.D.   On: 05/20/2021 22:53   DG ABD ACUTE 2+V W 1V CHEST  Result Date: 05/20/2021 CLINICAL DATA:  Abdominal cramping with nausea EXAM: DG ABDOMEN ACUTE WITH 1 VIEW CHEST COMPARISON:  CT 05/16/2021 FINDINGS: Single-view chest demonstrates no focal opacity or pleural effusion. Normal cardiomediastinal silhouette. Supine and upright views of the abdomen demonstrate no free air beneath the diaphragm. Multiple dilated loops of small bowel measuring up to 4.4 cm with multiple fluid levels on upright exam and relative paucity of distal gas could concerning for a bowel obstruction. IMPRESSION: Findings suspicious for small bowel obstruction. No acute cardiopulmonary disease. Electronically Signed   By: Donavan Foil M.D.   On: 05/20/2021 20:07        Scheduled Meds:  fentaNYL (SUBLIMAZE) injection  50 mcg Intravenous Once   ondansetron (ZOFRAN) IV  4 mg Intravenous Once   Continuous Infusions:  dextrose 5 %  and 0.9 % NaCl with KCl 20 mEq/L 100 mL/hr at 05/21/21 1022     LOS: 1 day    Time spent: 35 minutes    Blue Winther Darleen Crocker, DO [facility]  If 7PM-7AM, please contact night-coverage [email or web address] 05/21/2021, 11:45 AM

## 2021-05-21 NOTE — H&P (View-Only) (Signed)
[facility] Consult  Reason for Consult:Abdominal Pain/ Small Bowel Obstruction Referring Physician: Rodena Goldmann, DO  Chief Complaint   Abdominal Pain     HPI: Penny Garza is a 50 y.o. female with Nausea and abdominal pain.  The pain started on 6/30 and has had a duration of 6 days.  It is associated with vomiting and abdominal tenderness. Patient notes that she gets two hard painful lumps in the umbilical and super umbilical regions on occasion that are painful to touch. She has elevated LFTs on 7/4 and cloudy urine notable for a small amount of hemoglobin, ketones and protein. Past Medical History:  Diagnosis Date   Anemia    Complication of anesthesia    Patient woke up during procedure    Past Surgical History:  Procedure Laterality Date   BILATERAL SALPINGECTOMY Bilateral 05/01/2021   Procedure: OPEN BILATERAL SALPINGECTOMY and OOPHORECTOMY;  Surgeon: Florian Buff, MD;  Location: [facility];  Service: Gynecology;  Laterality: Bilateral;   COLONOSCOPY     ENDOMETRIAL BIOPSY     IUD REMOVAL     SUPRACERVICAL ABDOMINAL HYSTERECTOMY N/A 05/01/2021   Procedure: HYSTERECTOMY SUPRACERVICAL ABDOMINAL;  Surgeon: Florian Buff, MD;  Location: [facility];  Service: Gynecology;  Laterality: N/A;    Family History  Problem Relation Age of Onset   Diabetes Paternal Grandfather    Diabetes Paternal Grandmother    Diabetes Maternal Grandfather    Diabetes Father    Hypertension Father    Heart attack Mother     Social History   Tobacco Use   Smoking status: Never   Smokeless tobacco: Never  Vaping Use   Vaping Use: Never used  Substance Use Topics   Alcohol use: Not Currently   Drug use: Never    Medications: I have reviewed the patient's current medications.  Allergies  Allergen Reactions   Codeine Anaphylaxis   Iron Sucrose Rash   Azithromycin    Milk-Related Compounds Other (See Comments)    Stomach pain   Other Dermatitis    Certain metals       ROS:  Constitutional: negative Eyes: negative Ears, nose, mouth, throat, and face: negative Respiratory: negative Cardiovascular: negative Gastrointestinal: positive for abdominal pain, change in bowel habits, diarrhea, nausea, reflux symptoms, and vomiting  Blood pressure 128/61, pulse 81, temperature 98.2 F (36.8 C), temperature source Oral, resp. rate 16, last menstrual period 02/27/2021, SpO2 96 %. Physical Exam HENT:     Head: Normocephalic and atraumatic.  Eyes:     Extraocular Movements: Extraocular movements intact.  Cardiovascular:     Rate and Rhythm: Normal rate.  Pulmonary:     Effort: Pulmonary effort is normal.     Breath sounds: Normal breath sounds.  Abdominal:     General: A surgical scar is present.     Palpations: Abdomen is soft.     Tenderness: There is generalized abdominal tenderness and tenderness in the periumbilical area.     Hernia: A hernia is present. Hernia is present in the umbilical area.  Skin:    General: Skin is warm and dry.  Neurological:     Mental Status: She is alert.  Psychiatric:        Mood and Affect: Mood normal.        Behavior: Behavior normal.    Results: Results for orders placed or performed during the hospital encounter of 05/20/21 (from the past 48 hour(s))  CBC with Differential     Status: Abnormal  Collection Time: 05/20/21  4:52 PM  Result Value Ref Range   WBC 5.5 4.0 - 10.5 K/uL   RBC 3.71 (L) 3.87 - 5.11 MIL/uL   Hemoglobin 10.4 (L) 12.0 - 15.0 g/dL   HCT 34.8 (L) 36.0 - 46.0 %   MCV 93.8 80.0 - 100.0 fL   MCH 28.0 26.0 - 34.0 pg   MCHC 29.9 (L) 30.0 - 36.0 g/dL   RDW 15.8 (H) 11.5 - 15.5 %   Platelets 405 (H) 150 - 400 K/uL   nRBC 0.0 0.0 - 0.2 %   Neutrophils Relative % 69 %   Neutro Abs 3.8 1.7 - 7.7 K/uL   Lymphocytes Relative 21 %   Lymphs Abs 1.2 0.7 - 4.0 K/uL   Monocytes Relative 9 %   Monocytes Absolute 0.5 0.1 - 1.0 K/uL   Eosinophils Relative 1 %   Eosinophils Absolute 0.0 0.0 - 0.5  K/uL   Basophils Relative 0 %   Basophils Absolute 0.0 0.0 - 0.1 K/uL   Immature Granulocytes 0 %   Abs Immature Granulocytes 0.02 0.00 - 0.07 K/uL    Comment: Performed at [facility], [address] [place], [place] [address]  Comprehensive metabolic panel     Status: Abnormal   Collection Time: 05/20/21  4:52 PM  Result Value Ref Range   Sodium 141 135 - 145 mmol/L   Potassium 3.4 (L) 3.5 - 5.1 mmol/L   Chloride 105 98 - 111 mmol/L   CO2 25 22 - 32 mmol/L   Glucose, Bld 95 70 - 99 mg/dL    Comment: Glucose reference range applies only to samples taken after fasting for at least 8 hours.   BUN 9 6 - 20 mg/dL   Creatinine, Ser 0.75 0.44 - 1.00 mg/dL   Calcium 9.5 8.9 - 10.3 mg/dL   Total Protein 8.4 (H) 6.5 - 8.1 g/dL   Albumin 4.0 3.5 - 5.0 g/dL   AST 84 (H) 15 - 41 U/L   ALT 80 (H) 0 - 44 U/L   Alkaline Phosphatase 128 (H) 38 - 126 U/L   Total Bilirubin 1.8 (H) 0.3 - 1.2 mg/dL   GFR, Estimated >60 >60 mL/min    Comment: (NOTE) Calculated using the CKD-EPI Creatinine Equation (2021)    Anion gap 11 5 - 15    Comment: Performed at [facility], [address], [place], [place] [address]  Lipase, blood     Status: None   Collection Time: 05/20/21  4:52 PM  Result Value Ref Range   Lipase 20 11 - 51 U/L    Comment: Performed at [facility], [address] [place], [place] [address]  Urinalysis, Routine w reflex microscopic Urine, Clean Catch     Status: Abnormal   Collection Time: 05/20/21  5:27 PM  Result Value Ref Range   Color, Urine YELLOW YELLOW   APPearance CLOUDY (A) CLEAR   Specific Gravity, Urine 1.021 1.005 - 1.030   pH 7.0 5.0 - 8.0   Glucose, UA NEGATIVE NEGATIVE mg/dL   Hgb urine dipstick SMALL (A) NEGATIVE   Bilirubin Urine NEGATIVE NEGATIVE   Ketones, ur 80 (A) NEGATIVE mg/dL   Protein, ur 30 (A) NEGATIVE mg/dL   Nitrite NEGATIVE NEGATIVE   Leukocytes,Ua NEGATIVE NEGATIVE   RBC / HPF 0-5 0 - 5 RBC/hpf   WBC, UA 6-10 0 - 5 WBC/hpf   Bacteria, UA RARE  (A) NONE SEEN   Squamous Epithelial / LPF 11-20 0 - 5  Mucus PRESENT     Comment: Performed at [facility], [address], [place], [place] [address]  Resp Panel by RT-PCR (Flu A&B, Covid) Nasopharyngeal Swab     Status: None   Collection Time: 05/20/21 10:34 PM   Specimen: Nasopharyngeal Swab; Nasopharyngeal(NP) swabs in vial transport medium  Result Value Ref Range   SARS Coronavirus 2 by RT PCR NEGATIVE NEGATIVE    Comment: (NOTE) SARS-CoV-2 target nucleic acids are NOT DETECTED.  The SARS-CoV-2 RNA is generally detectable in upper respiratory specimens during the acute phase of infection. The lowest concentration of SARS-CoV-2 viral copies this assay can detect is 138 copies/mL. A negative result does not preclude SARS-Cov-2 infection and should not be used as the sole basis for treatment or other patient management decisions. A negative result may occur with  improper specimen collection/handling, submission of specimen other than nasopharyngeal swab, presence of viral mutation(s) within the areas targeted by this assay, and inadequate number of viral copies(<138 copies/mL). A negative result must be combined with clinical observations, patient history, and epidemiological information. The expected result is Negative.  Fact Sheet for Patients:  EntrepreneurPulse.com.au  Fact Sheet for Healthcare Providers:  IncredibleEmployment.be  This test is no t yet approved or cleared by the [place] FDA and  has been authorized for detection and/or diagnosis of SARS-CoV-2 by FDA under an Emergency Use Authorization (EUA). This EUA will remain  in effect (meaning this test can be used) for the duration of the COVID-19 declaration under Section 564(b)(1) of the Act, 21 U.S.C.section 360bbb-3(b)(1), unless the authorization is terminated  or revoked sooner.       Influenza A by PCR NEGATIVE NEGATIVE   Influenza B by PCR NEGATIVE NEGATIVE     Comment: (NOTE) The Xpert Xpress SARS-CoV-2/FLU/RSV plus assay is intended as an aid in the diagnosis of influenza from Nasopharyngeal swab specimens and should not be used as a sole basis for treatment. Nasal washings and aspirates are unacceptable for Xpert Xpress SARS-CoV-2/FLU/RSV testing.  Fact Sheet for Patients: EntrepreneurPulse.com.au  Fact Sheet for Healthcare Providers: IncredibleEmployment.be  This test is not yet approved or cleared by the [place] FDA and has been authorized for detection and/or diagnosis of SARS-CoV-2 by FDA under an Emergency Use Authorization (EUA). This EUA will remain in effect (meaning this test can be used) for the duration of the COVID-19 declaration under Section 564(b)(1) of the Act, 21 U.S.C. section 360bbb-3(b)(1), unless the authorization is terminated or revoked.  Performed at [facility], [address], [place], [place] [address]   Hepatic function panel     Status: Abnormal   Collection Time: 05/21/21  6:45 AM  Result Value Ref Range   Total Protein 7.2 6.5 - 8.1 g/dL   Albumin 3.6 3.5 - 5.0 g/dL   AST 70 (H) 15 - 41 U/L   ALT 73 (H) 0 - 44 U/L   Alkaline Phosphatase 120 38 - 126 U/L   Total Bilirubin 2.0 (H) 0.3 - 1.2 mg/dL   Bilirubin, Direct 0.6 (H) 0.0 - 0.2 mg/dL   Indirect Bilirubin 1.4 (H) 0.3 - 0.9 mg/dL    Comment: Performed at [facility], [address] [place], [place] [address]  CBC WITH DIFFERENTIAL     Status: Abnormal   Collection Time: 05/21/21  6:45 AM  Result Value Ref Range   WBC 4.5 4.0 - 10.5 K/uL   RBC 3.38 (L) 3.87 - 5.11 MIL/uL   Hemoglobin 9.4 (L) 12.0 - 15.0 g/dL  HCT 31.9 (L) 36.0 - 46.0 %   MCV 94.4 80.0 - 100.0 fL   MCH 27.8 26.0 - 34.0 pg   MCHC 29.5 (L) 30.0 - 36.0 g/dL   RDW 16.0 (H) 11.5 - 15.5 %   Platelets 364 150 - 400 K/uL   nRBC 0.0 0.0 - 0.2 %   Neutrophils Relative % 60 %   Neutro Abs 2.7 1.7 - 7.7 K/uL   Lymphocytes Relative 28 %    Lymphs Abs 1.3 0.7 - 4.0 K/uL   Monocytes Relative 11 %   Monocytes Absolute 0.5 0.1 - 1.0 K/uL   Eosinophils Relative 1 %   Eosinophils Absolute 0.0 0.0 - 0.5 K/uL   Basophils Relative 0 %   Basophils Absolute 0.0 0.0 - 0.1 K/uL   Immature Granulocytes 0 %   Abs Immature Granulocytes 0.01 0.00 - 0.07 K/uL    Comment: Performed at [facility], [address] [place], [place] [address]  Basic metabolic panel     Status: Abnormal   Collection Time: 05/21/21  6:45 AM  Result Value Ref Range   Sodium 140 135 - 145 mmol/L   Potassium 3.2 (L) 3.5 - 5.1 mmol/L   Chloride 107 98 - 111 mmol/L   CO2 25 22 - 32 mmol/L   Glucose, Bld 110 (H) 70 - 99 mg/dL    Comment: Glucose reference range applies only to samples taken after fasting for at least 8 hours.   BUN 8 6 - 20 mg/dL   Creatinine, Ser 0.63 0.44 - 1.00 mg/dL   Calcium 8.9 8.9 - 10.3 mg/dL   GFR, Estimated >60 >60 mL/min    Comment: (NOTE) Calculated using the CKD-EPI Creatinine Equation (2021)    Anion gap 8 5 - 15    Comment: Performed at [facility], [address] [place], [place] [address]    CT ABDOMEN PELVIS W CONTRAST  Result Date: 05/20/2021 CLINICAL DATA:  Nonlocalized acute abdominal pain. Total abdominal hysterectomy 05/01/2021 EXAM: CT ABDOMEN AND PELVIS WITH CONTRAST TECHNIQUE: Multidetector CT imaging of the abdomen and pelvis was performed using the standard protocol following bolus administration of intravenous contrast. CONTRAST:  118mL OMNIPAQUE IOHEXOL 300 MG/ML  SOLN COMPARISON:  CT abdomen pelvis 05/16/2021 FINDINGS: Lower chest: Normal in size without focal abnormality. Tiny hiatal hernia. Hepatobiliary: No focal liver abnormality. No gallstones, gallbladder wall thickening, or pericholecystic fluid. No biliary dilatation. Pancreas: No focal lesion. Normal pancreatic contour. No surrounding inflammatory changes. No main pancreatic ductal dilatation. Spleen: Normal in size without focal abnormality. Adrenals/Urinary  Tract: No adrenal nodule bilaterally. Bilateral kidneys enhance symmetrically. 1 mm calcified stone within the right kidney. No left nephrolithiasis. No hydronephrosis. No hydroureter. The urinary bladder is unremarkable. On delayed imaging, there is no urothelial wall thickening and there are no filling defects in the opacified portions of the bilateral collecting systems or ureters. Stomach/Bowel: Intraluminal contrast noted within the transverse colon, left colon, sigmoid colon rectum. Stomach is within normal limits. Several loops of small bowel within the lower abdomen and pelvis are mildly dilated with fluid measuring up to 3.3 cm. The distal small bowel distal to this is collapsed. No pneumatosis. No evidence of large bowel wall thickening or dilatation. Appendix appears normal. Appendicoliths noted within the lumen of the appendix. Vascular/Lymphatic: No abdominal aorta or iliac aneurysm. Mild atherosclerotic plaque of the aorta and its branches. No abdominal, pelvic, or inguinal lymphadenopathy. Reproductive: Status post hysterectomy and bilateral oophorectomy. Similar to prior. Other: Interval resolution of trace pelvic  free fluid. No intraperitoneal free gas. No organized fluid collection. Musculoskeletal: Stable small fat containing umbilical hernia. Stable small fat and free fluid containing supraumbilical ventral wall hernia (6:85-89). No suspicious lytic or blastic osseous lesions. No acute displaced fracture. Multilevel degenerative changes of the spine. IMPRESSION: 1. Early/mild small-bowel obstruction with a transition point within the lower mid abdomen in a patient status post hysterectomy and bilateral salpingo oophorectomy. No CT findings of bowel obstruction or ischemia. 2. Small hiatal hernia. 3. Similar-appearing couple small fat containing ventral wall hernias. Electronically Signed   By: Iven Finn M.D.   On: 05/20/2021 22:53   DG ABD ACUTE 2+V W 1V CHEST  Result Date:  05/20/2021 CLINICAL DATA:  Abdominal cramping with nausea EXAM: DG ABDOMEN ACUTE WITH 1 VIEW CHEST COMPARISON:  CT 05/16/2021 FINDINGS: Single-view chest demonstrates no focal opacity or pleural effusion. Normal cardiomediastinal silhouette. Supine and upright views of the abdomen demonstrate no free air beneath the diaphragm. Multiple dilated loops of small bowel measuring up to 4.4 cm with multiple fluid levels on upright exam and relative paucity of distal gas could concerning for a bowel obstruction. IMPRESSION: Findings suspicious for small bowel obstruction. No acute cardiopulmonary disease. Electronically Signed   By: Donavan Foil M.D.   On: 05/20/2021 20:07     Assessment & Plan:  Essica Kiker is a 50 y.o. female with a past medical history pertinent for a hysterectomy on 6/15, anemia and hernias in the umbilical region who presents with a 6 day history of nausea, vomiting and diffuse abdominal tenderness. CT on 7/4 showed a potential small bowel obstruction and an umbilical hernia and a periumbilical hernia. Her hernias harden and are tender on occasion. Due to acute hardening and pain of hernias around the time of initial presentation, pain is likely due to intermittent hernia incarceration. We will schedule a hernia repair to address this issue.    Gertie Fey 05/21/2021, 8:53 AM

## 2021-05-21 NOTE — Telephone Encounter (Signed)
Patient is currently hospitalized with SBO, plans for ventral hernia repair tomorrow. GI saw patient due to nausea, vomiting, and elevated LFTs. We have signed off. She will need a follow-up visit with Dr. Jenetta Downer in about 4 weeks. Please arrange. Dx: SOB, nausea/vomiting, elevated LFTs.

## 2021-05-22 ENCOUNTER — Encounter (HOSPITAL_COMMUNITY)
Admission: EM | Disposition: A | Discharge status: Home / Self Care | Payer: Self-pay | Source: Home / Self Care | Attending: Internal Medicine

## 2021-05-22 ENCOUNTER — Inpatient Hospital Stay (HOSPITAL_COMMUNITY): Payer: 59 | Admitting: Anesthesiology

## 2021-05-22 ENCOUNTER — Other Ambulatory Visit: Payer: Self-pay

## 2021-05-22 DIAGNOSIS — K436 Other and unspecified ventral hernia with obstruction, without gangrene: Secondary | ICD-10-CM

## 2021-05-22 DIAGNOSIS — K565 Intestinal adhesions [bands], unspecified as to partial versus complete obstruction: Secondary | ICD-10-CM

## 2021-05-22 HISTORY — PX: VENTRAL HERNIA REPAIR: SHX424

## 2021-05-22 HISTORY — PX: LAPAROSCOPIC LYSIS OF ADHESIONS: SHX5905

## 2021-05-22 LAB — COMPREHENSIVE METABOLIC PANEL
ALT: 80 U/L — ABNORMAL HIGH (ref 0–44)
AST: 79 U/L — ABNORMAL HIGH (ref 15–41)
Albumin: 3.7 g/dL (ref 3.5–5.0)
Alkaline Phosphatase: 130 U/L — ABNORMAL HIGH (ref 38–126)
Anion gap: 11 (ref 5–15)
BUN: 8 mg/dL (ref 6–20)
CO2: 23 mmol/L (ref 22–32)
Calcium: 9.4 mg/dL (ref 8.9–10.3)
Chloride: 107 mmol/L (ref 98–111)
Creatinine, Ser: 0.57 mg/dL (ref 0.44–1.00)
GFR, Estimated: >60 mL/min (ref >60)
Glucose, Bld: 93 mg/dL (ref 70–99)
Potassium: 3.4 mmol/L — ABNORMAL LOW (ref 3.5–5.1)
Sodium: 141 mmol/L (ref 135–145)
Total Bilirubin: 1.8 mg/dL — ABNORMAL HIGH (ref 0.3–1.2)
Total Protein: 7.6 g/dL (ref 6.5–8.1)

## 2021-05-22 LAB — SURGICAL PCR SCREEN
MRSA, PCR: NEGATIVE
Staphylococcus aureus: NEGATIVE

## 2021-05-22 LAB — MAGNESIUM: Magnesium: 2 mg/dL (ref 1.7–2.4)

## 2021-05-22 LAB — CBC
HCT: 32.8 % — ABNORMAL LOW (ref 36.0–46.0)
Hemoglobin: 9.6 g/dL — ABNORMAL LOW (ref 12.0–15.0)
MCH: 28.2 pg (ref 26.0–34.0)
MCHC: 29.3 g/dL — ABNORMAL LOW (ref 30.0–36.0)
MCV: 96.5 fL (ref 80.0–100.0)
Platelets: 375 10*3/uL (ref 150–400)
RBC: 3.4 MIL/uL — ABNORMAL LOW (ref 3.87–5.11)
RDW: 15.9 % — ABNORMAL HIGH (ref 11.5–15.5)
WBC: 4.5 10*3/uL (ref 4.0–10.5)
nRBC: 0 % (ref 0.0–0.2)

## 2021-05-22 SURGERY — REPAIR, HERNIA, VENTRAL
Anesthesia: General | Site: Abdomen

## 2021-05-22 MED ORDER — POVIDONE-IODINE 10 % OINT PACKET
TOPICAL_OINTMENT | CUTANEOUS | Status: DC | PRN
Start: 2021-05-22 — End: 2021-05-22
  Administered 2021-05-22: 1 via TOPICAL

## 2021-05-22 MED ORDER — SUCCINYLCHOLINE CHLORIDE 20 MG/ML IJ SOLN
INTRAMUSCULAR | Status: DC | PRN
  Administered 2021-05-22: 200 mg via INTRAVENOUS

## 2021-05-22 MED ORDER — CHLORHEXIDINE GLUCONATE 0.12 % MT SOLN
15.0000 mL | Freq: Once | OROMUCOSAL | Status: AC
  Administered 2021-05-22: 15 mL via OROMUCOSAL

## 2021-05-22 MED ORDER — LIDOCAINE HCL (PF) 2 % IJ SOLN
INTRAMUSCULAR | Status: AC
  Filled 2021-05-22: qty 5

## 2021-05-22 MED ORDER — SIMETHICONE 80 MG PO CHEW
40.0000 mg | CHEWABLE_TABLET | Freq: Four times a day (QID) | ORAL | Status: DC | PRN
  Administered 2021-05-22 – 2021-05-23 (×2): 40 mg via ORAL
  Filled 2021-05-22 (×2): qty 1

## 2021-05-22 MED ORDER — KETOROLAC TROMETHAMINE 30 MG/ML IJ SOLN
30.0000 mg | Freq: Four times a day (QID) | INTRAMUSCULAR | Status: AC | PRN
  Administered 2021-05-23: 30 mg via INTRAVENOUS
  Filled 2021-05-22: qty 1

## 2021-05-22 MED ORDER — ROCURONIUM BROMIDE 10 MG/ML (PF) SYRINGE
PREFILLED_SYRINGE | INTRAVENOUS | Status: DC | PRN
  Administered 2021-05-22: 10 mg via INTRAVENOUS
  Administered 2021-05-22: 40 mg via INTRAVENOUS

## 2021-05-22 MED ORDER — MEPERIDINE HCL 50 MG/ML IJ SOLN: 6.2500 mg | INTRAMUSCULAR | Status: DC | PRN

## 2021-05-22 MED ORDER — FENTANYL CITRATE (PF) 100 MCG/2ML IJ SOLN
25.0000 ug | INTRAMUSCULAR | Status: DC | PRN
  Administered 2021-05-22 (×2): 50 ug via INTRAVENOUS
  Filled 2021-05-22: qty 2

## 2021-05-22 MED ORDER — KCL IN DEXTROSE-NACL 20-5-0.45 MEQ/L-%-% IV SOLN: INTRAVENOUS | Status: DC

## 2021-05-22 MED ORDER — FENTANYL CITRATE (PF) 250 MCG/5ML IJ SOLN
INTRAMUSCULAR | Status: AC
  Filled 2021-05-22: qty 5

## 2021-05-22 MED ORDER — ORAL CARE MOUTH RINSE: 15.0000 mL | Freq: Once | OROMUCOSAL | Status: AC

## 2021-05-22 MED ORDER — ONDANSETRON HCL 4 MG/2ML IJ SOLN
4.0000 mg | Freq: Four times a day (QID) | INTRAMUSCULAR | Status: DC | PRN
  Administered 2021-05-23: 4 mg via INTRAVENOUS
  Filled 2021-05-22 (×2): qty 2

## 2021-05-22 MED ORDER — FENTANYL CITRATE (PF) 100 MCG/2ML IJ SOLN
INTRAMUSCULAR | Status: DC | PRN
  Administered 2021-05-22: 50 ug via INTRAVENOUS
  Administered 2021-05-22: 150 ug via INTRAVENOUS
  Administered 2021-05-22 (×3): 50 ug via INTRAVENOUS

## 2021-05-22 MED ORDER — CHLORHEXIDINE GLUCONATE CLOTH 2 % EX PADS: 6.0000 | MEDICATED_PAD | Freq: Once | CUTANEOUS | Status: DC

## 2021-05-22 MED ORDER — DIPHENHYDRAMINE HCL 50 MG/ML IJ SOLN
INTRAMUSCULAR | Status: AC
  Filled 2021-05-22: qty 1

## 2021-05-22 MED ORDER — CEFAZOLIN IN SODIUM CHLORIDE 3-0.9 GM/100ML-% IV SOLN
INTRAVENOUS | Status: AC
  Filled 2021-05-22: qty 100

## 2021-05-22 MED ORDER — POVIDONE-IODINE 10 % EX OINT
TOPICAL_OINTMENT | CUTANEOUS | Status: AC
  Filled 2021-05-22: qty 1

## 2021-05-22 MED ORDER — TRAMADOL HCL 50 MG PO TABS
50.0000 mg | ORAL_TABLET | Freq: Four times a day (QID) | ORAL | Status: DC | PRN
  Administered 2021-05-27: 50 mg via ORAL
  Filled 2021-05-22: qty 1

## 2021-05-22 MED ORDER — PROPOFOL 10 MG/ML IV BOLUS
INTRAVENOUS | Status: DC | PRN
  Administered 2021-05-22: 200 mg via INTRAVENOUS

## 2021-05-22 MED ORDER — 0.9 % SODIUM CHLORIDE (POUR BTL) OPTIME
TOPICAL | Status: DC | PRN
  Administered 2021-05-22: 1000 mL

## 2021-05-22 MED ORDER — MIDAZOLAM HCL 5 MG/5ML IJ SOLN
INTRAMUSCULAR | Status: DC | PRN
  Administered 2021-05-22: 2 mg via INTRAVENOUS

## 2021-05-22 MED ORDER — BUPIVACAINE LIPOSOME 1.3 % IJ SUSP
INTRAMUSCULAR | Status: AC
  Filled 2021-05-22: qty 20

## 2021-05-22 MED ORDER — PROMETHAZINE HCL 25 MG/ML IJ SOLN: 6.2500 mg | INTRAMUSCULAR | Status: DC | PRN

## 2021-05-22 MED ORDER — MIDAZOLAM HCL 2 MG/2ML IJ SOLN
INTRAMUSCULAR | Status: AC
  Filled 2021-05-22: qty 2

## 2021-05-22 MED ORDER — ONDANSETRON 4 MG PO TBDP: 4.0000 mg | ORAL_TABLET | Freq: Four times a day (QID) | ORAL | Status: DC | PRN

## 2021-05-22 MED ORDER — LACTATED RINGERS IV SOLN: INTRAVENOUS | Status: DC

## 2021-05-22 MED ORDER — SUGAMMADEX SODIUM 200 MG/2ML IV SOLN
INTRAVENOUS | Status: DC | PRN
  Administered 2021-05-22: 200 mg via INTRAVENOUS

## 2021-05-22 MED ORDER — ENOXAPARIN SODIUM 40 MG/0.4ML IJ SOSY: 40.0000 mg | PREFILLED_SYRINGE | INTRAMUSCULAR | Status: DC

## 2021-05-22 MED ORDER — LIDOCAINE 2% (20 MG/ML) 5 ML SYRINGE
INTRAMUSCULAR | Status: DC | PRN
  Administered 2021-05-22: 100 mg via INTRAVENOUS

## 2021-05-22 MED ORDER — MUPIROCIN 2 % EX OINT
1.0000 "application " | TOPICAL_OINTMENT | Freq: Two times a day (BID) | CUTANEOUS | Status: DC
  Administered 2021-05-22: 1 via NASAL

## 2021-05-22 MED ORDER — KETOROLAC TROMETHAMINE 30 MG/ML IJ SOLN
30.0000 mg | Freq: Four times a day (QID) | INTRAMUSCULAR | Status: AC
  Administered 2021-05-23: 30 mg via INTRAVENOUS
  Filled 2021-05-22: qty 1

## 2021-05-22 MED ORDER — DIPHENHYDRAMINE HCL 50 MG/ML IJ SOLN
25.0000 mg | Freq: Once | INTRAMUSCULAR | Status: AC
  Administered 2021-05-22: 25 mg via INTRAVENOUS

## 2021-05-22 MED ORDER — PROPOFOL 10 MG/ML IV BOLUS
INTRAVENOUS | Status: AC
  Filled 2021-05-22: qty 40

## 2021-05-22 MED ORDER — BUPIVACAINE HCL (300 MG DOSE) 3 X 100 MG IL IMPL
DRUG_IMPLANT | Status: DC | PRN
  Administered 2021-05-22: 300 mg

## 2021-05-22 MED ORDER — SUCCINYLCHOLINE CHLORIDE 200 MG/10ML IV SOSY
PREFILLED_SYRINGE | INTRAVENOUS | Status: AC
  Filled 2021-05-22: qty 10

## 2021-05-22 MED ORDER — CEFAZOLIN IN SODIUM CHLORIDE 3-0.9 GM/100ML-% IV SOLN
3.0000 g | INTRAVENOUS | Status: AC
  Administered 2021-05-22: 3 g via INTRAVENOUS
  Filled 2021-05-22: qty 100

## 2021-05-22 MED ORDER — ENOXAPARIN SODIUM 60 MG/0.6ML IJ SOSY
60.0000 mg | PREFILLED_SYRINGE | INTRAMUSCULAR | Status: DC
  Administered 2021-05-23 – 2021-05-29 (×7): 60 mg via SUBCUTANEOUS
  Filled 2021-05-22 (×7): qty 0.6

## 2021-05-22 SURGICAL SUPPLY — 44 items
CHLORAPREP W/TINT 26 (MISCELLANEOUS) ×2 IMPLANT
CLOTH BEACON ORANGE TIMEOUT ST (SAFETY) ×2 IMPLANT
COVER LIGHT HANDLE STERIS (MISCELLANEOUS) ×4 IMPLANT
DRSG OPSITE POSTOP 4X10 (GAUZE/BANDAGES/DRESSINGS) ×1 IMPLANT
ELECT REM PT RETURN 9FT ADLT (ELECTROSURGICAL) ×2
ELECTRODE REM PT RTRN 9FT ADLT (ELECTROSURGICAL) ×1 IMPLANT
GAUZE 4X4 16PLY ~~LOC~~+RFID DBL (SPONGE) ×1 IMPLANT
GAUZE SPONGE 4X4 12PLY STRL (GAUZE/BANDAGES/DRESSINGS) ×2 IMPLANT
GLOVE SRG 8 PF TXTR STRL LF DI (GLOVE) IMPLANT
GLOVE SURG SS PI 7.5 STRL IVOR (GLOVE) ×2 IMPLANT
GLOVE SURG UNDER POLY LF SZ6.5 (GLOVE) ×1 IMPLANT
GLOVE SURG UNDER POLY LF SZ7 (GLOVE) ×6 IMPLANT
GLOVE SURG UNDER POLY LF SZ8 (GLOVE) ×2
GOWN STRL REUS W/TWL LRG LVL3 (GOWN DISPOSABLE) ×6 IMPLANT
GOWN STRL REUS W/TWL XL LVL3 (GOWN DISPOSABLE) ×1 IMPLANT
HANDLE SUCTION POOLE (INSTRUMENTS) IMPLANT
INST SET MAJOR GENERAL (KITS) ×2 IMPLANT
KIT TURNOVER KIT A (KITS) ×2 IMPLANT
LIGASURE IMPACT 36 18CM CVD LR (INSTRUMENTS) ×1 IMPLANT
MANIFOLD NEPTUNE II (INSTRUMENTS) ×2 IMPLANT
MESH VENTRALIGHT ST 6X8 (Mesh Specialty) ×1 IMPLANT
MESH VENTRLGHT ELLIPSE 8X6XMFL (Mesh Specialty) IMPLANT
NDL HYPO 18GX1.5 BLUNT FILL (NEEDLE) ×1 IMPLANT
NDL HYPO 21X1.5 SAFETY (NEEDLE) ×1 IMPLANT
NEEDLE HYPO 18GX1.5 BLUNT FILL (NEEDLE) IMPLANT
NEEDLE HYPO 21X1.5 SAFETY (NEEDLE) ×2 IMPLANT
NS IRRIG 1000ML POUR BTL (IV SOLUTION) ×2 IMPLANT
PACK MAJOR ABDOMINAL (CUSTOM PROCEDURE TRAY) ×2 IMPLANT
PAD ARMBOARD 7.5X6 YLW CONV (MISCELLANEOUS) ×2 IMPLANT
SET BASIN LINEN APH (SET/KITS/TRAYS/PACK) ×2 IMPLANT
SPONGE T-LAP 18X18 ~~LOC~~+RFID (SPONGE) ×2 IMPLANT
STAPLER VISISTAT (STAPLE) ×1 IMPLANT
SUCTION POOLE HANDLE (INSTRUMENTS) ×2
SUT ETHIBOND NAB MO 7 #0 18IN (SUTURE) ×2 IMPLANT
SUT MNCRL AB 4-0 PS2 18 (SUTURE) ×1 IMPLANT
SUT NOVA NAB GS-21 1 T12 (SUTURE) IMPLANT
SUT NOVA NAB GS-26 0 60 (SUTURE) ×2 IMPLANT
SUT PROLENE 0 CT 1 CR/8 (SUTURE) ×2 IMPLANT
SUT SILK 3 0 SH CR/8 (SUTURE) ×2 IMPLANT
SUT VIC AB 2-0 CT2 27 (SUTURE) ×1 IMPLANT
SUT VIC AB 3-0 SH 27 (SUTURE)
SUT VIC AB 3-0 SH 27X BRD (SUTURE) ×1 IMPLANT
SUT VICRYL AB 2 0 TIES (SUTURE) IMPLANT
SYR 20ML LL LF (SYRINGE) ×3 IMPLANT

## 2021-05-22 NOTE — Progress Notes (Signed)
PROGRESS NOTE    Penny Garza  UTM:[id number] DOB: 11/27/1970 DOA: 05/20/2021 PCP: [facility]   Brief Narrative:   Penny Garza is a 50 y.o. female with no significant past medical history presents to the ER for the second time in 5 days with complaints of nausea vomiting abdominal discomfort.  Patient was admitted with intractable nausea and vomiting with possible partial small bowel obstruction.  She is also noted to have some mild transaminitis with right upper quadrant ultrasound demonstrating some cholelithiasis and gallbladder sludge.  Plans are for ventral herniorrhaphy with mesh this afternoon.  Assessment & Plan:   Principal Problem:   Nausea & vomiting Active Problems:   S/P TAH-BSO (total abdominal hysterectomy and bilateral salpingo-oophorectomy)   Abdominal pain   Elevated LFTs   Intractable nausea and vomiting with possible SBO -Noted to have ongoing abdominal pain with nausea and vomiting this morning and no BM -Appreciate general surgery recommendations with plans for eventual herniorrhaphy today -Appreciate gynecology evaluation -Continue IV fluid with potassium supplementation -N.p.o. -Antiemetics   Mild transaminitis -Continue to follow -Noted to have gallbladder sludge with cholelithiasis -Appreciate GI recommendations for follow-up in the outpatient setting in 4 weeks   Hypokalemia -Continue ongoing IV fluid supplementation -Repeat labs in a.m.   Recent total abdominal hysterectomy -Appreciate gynecology recommendations   Anemia -Hemoglobin remaining stable -No overt bleeding identified -Continue to monitor   Morbid obesity -Lifestyle changes outpatient     DVT prophylaxis: SCDs Code Status: Full Family Communication: None at bedside Disposition Plan:  Status is: Inpatient   Remains inpatient appropriate because:IV treatments appropriate due to intensity of illness or inability to take PO and Inpatient level of care appropriate due to  severity of illness   Dispo: The patient is from: Home              Anticipated d/c is to: Home              Patient currently is not medically stable to d/c.              Difficult to place patient No     Consultants:  General surgery GI Gynecology   Procedures:  See below   Antimicrobials:  None  Subjective: Patient seen and evaluated today with no new acute complaints or concerns. No acute concerns or events noted overnight.  She denies any nausea or vomiting states her abdominal pain has improved.  She states that she started having bowel movements yesterday evening.  Objective: Vitals:   05/21/21 0821 05/21/21 1432 05/21/21 2101 05/22/21 0546  BP: 128/61 (!) 151/75 (!) 155/75 (!) 150/67  Pulse: 81 71 73 72  Resp: 16 20 20 20   Temp: 98.2 F (36.8 C) 98.2 F (36.8 C) 98 F (36.7 C) 98.2 F (36.8 C)  TempSrc: Oral Oral Oral Oral  SpO2: 96% 99% 99% 99%    Intake/Output Summary (Last 24 hours) at 05/22/2021 1042 Last data filed at 05/22/2021 0900 Gross per 24 hour  Intake 606.37 ml  Output --  Net 606.37 ml   There were no vitals filed for this visit.  Examination:  General exam: Appears calm and comfortable, obese Respiratory system: Clear to auscultation. Respiratory effort normal. Cardiovascular system: S1 & S2 heard, RRR.  Gastrointestinal system: Abdomen is soft Central nervous system: Alert and awake Extremities: No edema Skin: No significant lesions noted Psychiatry: Flat affect.    Data Reviewed: I have personally reviewed following labs and imaging studies  CBC: Recent Labs  Lab 05/16/21 0911 05/20/21 1652 05/21/21 0645 05/22/21 0427  WBC 17.5* 5.5 4.5 4.5  NEUTROABS 13.7* 3.8 2.7  --   HGB 12.7 10.4* 9.4* 9.6*  HCT 42.0 34.8* 31.9* 32.8*  MCV 95.5 93.8 94.4 96.5  PLT 573* 405* 364 010   Basic Metabolic Panel: Recent Labs  Lab 05/16/21 0911 05/20/21 1652 05/21/21 0645 05/22/21 0427  NA 136 141 140 141  K 3.7 3.4* 3.2* 3.4*  CL  101 105 107 107  CO2 19* 25 25 23   GLUCOSE 160* 95 110* 93  BUN 15 9 8 8   CREATININE 0.82 0.75 0.63 0.57  CALCIUM 10.2 9.5 8.9 9.4  MG  --   --   --  2.0   GFR: Estimated Creatinine Clearance: 114.6 mL/min (by C-G formula based on SCr of 0.57 mg/dL). Liver Function Tests: Recent Labs  Lab 05/16/21 0911 05/20/21 1652 05/21/21 0645 05/22/21 0427  AST 32 84* 70* 79*  ALT 34 80* 73* 80*  ALKPHOS 153* 128* 120 130*  BILITOT 1.1 1.8* 2.0* 1.8*  PROT 8.9* 8.4* 7.2 7.6  ALBUMIN 4.3 4.0 3.6 3.7   Recent Labs  Lab 05/16/21 0911 05/20/21 1652  LIPASE 23 20   No results for input(s): AMMONIA in the last 168 hours. Coagulation Profile: No results for input(s): INR, PROTIME in the last 168 hours. Cardiac Enzymes: No results for input(s): CKTOTAL, CKMB, CKMBINDEX, TROPONINI in the last 168 hours. BNP (last 3 results) No results for input(s): PROBNP in the last 8760 hours. HbA1C: No results for input(s): HGBA1C in the last 72 hours. CBG: Recent Labs  Lab 05/16/21 0833  GLUCAP 175*   Lipid Profile: No results for input(s): CHOL, HDL, LDLCALC, TRIG, CHOLHDL, LDLDIRECT in the last 72 hours. Thyroid Function Tests: No results for input(s): TSH, T4TOTAL, FREET4, T3FREE, THYROIDAB in the last 72 hours. Anemia Panel: No results for input(s): VITAMINB12, FOLATE, FERRITIN, TIBC, IRON, RETICCTPCT in the last 72 hours. Sepsis Labs: No results for input(s): PROCALCITON, LATICACIDVEN in the last 168 hours.  Recent Results (from the past 240 hour(s))  Resp Panel by RT-PCR (Flu A&B, Covid) Nasopharyngeal Swab     Status: None   Collection Time: 05/20/21 10:34 PM   Specimen: Nasopharyngeal Swab; Nasopharyngeal(NP) swabs in vial transport medium  Result Value Ref Range Status   SARS Coronavirus 2 by RT PCR NEGATIVE NEGATIVE Final    Comment: (NOTE) SARS-CoV-2 target nucleic acids are NOT DETECTED.  The SARS-CoV-2 RNA is generally detectable in upper respiratory specimens during the  acute phase of infection. The lowest concentration of SARS-CoV-2 viral copies this assay can detect is 138 copies/mL. A negative result does not preclude SARS-Cov-2 infection and should not be used as the sole basis for treatment or other patient management decisions. A negative result may occur with  improper specimen collection/handling, submission of specimen other than nasopharyngeal swab, presence of viral mutation(s) within the areas targeted by this assay, and inadequate number of viral copies(<138 copies/mL). A negative result must be combined with clinical observations, patient history, and epidemiological information. The expected result is Negative.  Fact Sheet for Patients:  EntrepreneurPulse.com.au  Fact Sheet for Healthcare Providers:  IncredibleEmployment.be  This test is no t yet approved or cleared by the [place] FDA and  has been authorized for detection and/or diagnosis of SARS-CoV-2 by FDA under an Emergency Use Authorization (EUA). This EUA will remain  in effect (meaning this test can be used) for the duration of the COVID-19 declaration under Section  564(b)(1) of the Act, 21 U.S.C.section 360bbb-3(b)(1), unless the authorization is terminated  or revoked sooner.       Influenza A by PCR NEGATIVE NEGATIVE Final   Influenza B by PCR NEGATIVE NEGATIVE Final    Comment: (NOTE) The Xpert Xpress SARS-CoV-2/FLU/RSV plus assay is intended as an aid in the diagnosis of influenza from Nasopharyngeal swab specimens and should not be used as a sole basis for treatment. Nasal washings and aspirates are unacceptable for Xpert Xpress SARS-CoV-2/FLU/RSV testing.  Fact Sheet for Patients: EntrepreneurPulse.com.au  Fact Sheet for Healthcare Providers: IncredibleEmployment.be  This test is not yet approved or cleared by the [place] FDA and has been authorized for detection and/or  diagnosis of SARS-CoV-2 by FDA under an Emergency Use Authorization (EUA). This EUA will remain in effect (meaning this test can be used) for the duration of the COVID-19 declaration under Section 564(b)(1) of the Act, 21 U.S.C. section 360bbb-3(b)(1), unless the authorization is terminated or revoked.  Performed at [facility], [address] [place], [place] [address]   Surgical PCR screen     Status: None   Collection Time: 05/22/21 12:53 AM   Specimen: Nasal Mucosa; Nasal Swab  Result Value Ref Range Status   MRSA, PCR NEGATIVE NEGATIVE Final   Staphylococcus aureus NEGATIVE NEGATIVE Final    Comment: (NOTE) The Xpert SA Assay (FDA approved for NASAL specimens in patients 7 years of age and older), is one component of a comprehensive surveillance program. It is not intended to diagnose infection nor to guide or monitor treatment. Performed at [facility], [address], [place], [place] [address]          Radiology Studies: CT ABDOMEN PELVIS W CONTRAST  Result Date: 05/20/2021 CLINICAL DATA:  Nonlocalized acute abdominal pain. Total abdominal hysterectomy 05/01/2021 EXAM: CT ABDOMEN AND PELVIS WITH CONTRAST TECHNIQUE: Multidetector CT imaging of the abdomen and pelvis was performed using the standard protocol following bolus administration of intravenous contrast. CONTRAST:  183mL OMNIPAQUE IOHEXOL 300 MG/ML  SOLN COMPARISON:  CT abdomen pelvis 05/16/2021 FINDINGS: Lower chest: Normal in size without focal abnormality. Tiny hiatal hernia. Hepatobiliary: No focal liver abnormality. No gallstones, gallbladder wall thickening, or pericholecystic fluid. No biliary dilatation. Pancreas: No focal lesion. Normal pancreatic contour. No surrounding inflammatory changes. No main pancreatic ductal dilatation. Spleen: Normal in size without focal abnormality. Adrenals/Urinary Tract: No adrenal nodule bilaterally. Bilateral kidneys enhance symmetrically. 1 mm calcified stone within the right  kidney. No left nephrolithiasis. No hydronephrosis. No hydroureter. The urinary bladder is unremarkable. On delayed imaging, there is no urothelial wall thickening and there are no filling defects in the opacified portions of the bilateral collecting systems or ureters. Stomach/Bowel: Intraluminal contrast noted within the transverse colon, left colon, sigmoid colon rectum. Stomach is within normal limits. Several loops of small bowel within the lower abdomen and pelvis are mildly dilated with fluid measuring up to 3.3 cm. The distal small bowel distal to this is collapsed. No pneumatosis. No evidence of large bowel wall thickening or dilatation. Appendix appears normal. Appendicoliths noted within the lumen of the appendix. Vascular/Lymphatic: No abdominal aorta or iliac aneurysm. Mild atherosclerotic plaque of the aorta and its branches. No abdominal, pelvic, or inguinal lymphadenopathy. Reproductive: Status post hysterectomy and bilateral oophorectomy. Similar to prior. Other: Interval resolution of trace pelvic free fluid. No intraperitoneal free gas. No organized fluid collection. Musculoskeletal: Stable small fat containing umbilical hernia. Stable small fat and free fluid containing supraumbilical ventral wall hernia (6:85-89). No suspicious lytic  or blastic osseous lesions. No acute displaced fracture. Multilevel degenerative changes of the spine. IMPRESSION: 1. Early/mild small-bowel obstruction with a transition point within the lower mid abdomen in a patient status post hysterectomy and bilateral salpingo oophorectomy. No CT findings of bowel obstruction or ischemia. 2. Small hiatal hernia. 3. Similar-appearing couple small fat containing ventral wall hernias. Electronically Signed   By: Iven Finn M.D.   On: 05/20/2021 22:53   DG ABD ACUTE 2+V W 1V CHEST  Result Date: 05/20/2021 CLINICAL DATA:  Abdominal cramping with nausea EXAM: DG ABDOMEN ACUTE WITH 1 VIEW CHEST COMPARISON:  CT 05/16/2021  FINDINGS: Single-view chest demonstrates no focal opacity or pleural effusion. Normal cardiomediastinal silhouette. Supine and upright views of the abdomen demonstrate no free air beneath the diaphragm. Multiple dilated loops of small bowel measuring up to 4.4 cm with multiple fluid levels on upright exam and relative paucity of distal gas could concerning for a bowel obstruction. IMPRESSION: Findings suspicious for small bowel obstruction. No acute cardiopulmonary disease. Electronically Signed   By: Donavan Foil M.D.   On: 05/20/2021 20:07   US Abdomen Limited RUQ (LIVER/GB)  Result Date: 05/21/2021 CLINICAL DATA:  Elevated LFTs. EXAM: ULTRASOUND ABDOMEN LIMITED RIGHT UPPER QUADRANT COMPARISON:  CT 05/20/2021. FINDINGS: Gallbladder: Gallstones and sludge noted. Gallbladder wall thickness 1.9 mm. Negative Murphy sign. Common bile duct: Diameter: 2.9 mm Liver: Increased echogenicity consistent with fatty infiltration or hepatocellular disease. No focal hepatic abnormality identified. Portal vein is patent on color Doppler imaging with normal direction of blood flow towards the liver. Other: None. IMPRESSION: 1. Gallstones and sludge noted. No evidence of cholecystitis or biliary distention. 2. Increased hepatic echogenicity consistent fatty infiltration or hepatocellular disease. Electronically Signed   By: Marcello Moores  Register   On: 05/21/2021 15:32        Scheduled Meds:  fentaNYL (SUBLIMAZE) injection  50 mcg Intravenous Once   ondansetron (ZOFRAN) IV  4 mg Intravenous Once   Continuous Infusions:  dextrose 5 % and 0.45 % NaCl with KCl 20 mEq/L       LOS: 2 days    Time spent: 35 minutes    Brianca Fortenberry Darleen Crocker, DO [facility]  If 7PM-7AM, please contact night-coverage [email or web address] 05/22/2021, 10:42 AM

## 2021-05-22 NOTE — Interval H&P Note (Signed)
History and Physical Interval Note:  05/22/2021 1:47 PM  Penny Garza  has presented today for surgery, with the diagnosis of ventral hernia.  The various methods of treatment have been discussed with the patient and family. After consideration of risks, benefits and other options for treatment, the patient has consented to  Procedure(s): HERNIA REPAIR VENTRAL ADULT (N/A) as a surgical intervention.  The patient's history has been reviewed, patient examined, no change in status, stable for surgery.  I have reviewed the patient's chart and labs.  Questions were answered to the patient's satisfaction.     Aviva Signs

## 2021-05-22 NOTE — Transfer of Care (Signed)
Immediate Anesthesia Transfer of Care Note  Patient: Penny Garza  Procedure(s) Performed: VENTRAL HERNIORRHAPHY WITH MESH (Abdomen) LYSIS OF ADHESIONS (Abdomen)  Patient Location: PACU  Anesthesia Type:General  Level of Consciousness: awake, alert , oriented and patient cooperative  Airway & Oxygen Therapy: Patient Spontanous Breathing and Patient connected to face mask oxygen  Post-op Assessment: Report given to RN, Post -op Vital signs reviewed and stable and Patient moving all extremities X 4  Post vital signs: Reviewed and stable  Last Vitals:  Vitals Value Taken Time  BP    Temp    Pulse    Resp    SpO2      Last Pain:  Vitals:   05/22/21 1326  TempSrc: Oral  PainSc:       Patients Stated Pain Goal: 5 (33/61/22 4497)  Complications: No notable events documented.

## 2021-05-22 NOTE — Anesthesia Procedure Notes (Signed)
Procedure Name: Intubation Date/Time: 05/22/2021 2:23 PM Performed by: Tacy Learn, CRNA Pre-anesthesia Checklist: Patient identified, Emergency Drugs available, Suction available, Patient being monitored and Timeout performed Patient Re-evaluated:Patient Re-evaluated prior to induction Oxygen Delivery Method: Circle system utilized Preoxygenation: Pre-oxygenation with 100% oxygen Induction Type: IV induction Laryngoscope Size: Miller and 2 Grade View: Grade I Tube type: Oral Tube size: 7.0 mm Number of attempts: 1 Airway Equipment and Method: Stylet Placement Confirmation: ETT inserted through vocal cords under direct vision, positive ETCO2, CO2 detector and breath sounds checked- equal and bilateral Secured at: 21 cm Tube secured with: Tape Dental Injury: Teeth and Oropharynx as per pre-operative assessment

## 2021-05-22 NOTE — Progress Notes (Signed)
Is given to patient, pt did fair 850 - 1000 x 10

## 2021-05-22 NOTE — Anesthesia Postprocedure Evaluation (Signed)
Anesthesia Post Note  Patient: Jaquay Morneault  Procedure(s) Performed: VENTRAL HERNIORRHAPHY WITH MESH (Abdomen) LYSIS OF ADHESIONS (Abdomen)  Patient location during evaluation: PACU Anesthesia Type: General Level of consciousness: awake and alert, oriented, sedated and patient cooperative Pain management: pain level controlled Vital Signs Assessment: post-procedure vital signs reviewed and stable Respiratory status: spontaneous breathing and respiratory function stable Cardiovascular status: blood pressure returned to baseline and stable Postop Assessment: no apparent nausea or vomiting Anesthetic complications: no Comments: Patient c/o itching, no rash diphenhydramine 25 mg iv was given.   No notable events documented.   Last Vitals:  Vitals:   05/22/21 1630 05/22/21 1645  BP: (!) 162/87 (!) 164/87  Pulse: 82 79  Resp: (!) 30 (!) 32  Temp:    SpO2: 97% 98%    Last Pain:  Vitals:   05/22/21 1645  TempSrc:   PainSc: Asleep                 Filipe Greathouse C Samariya Rockhold

## 2021-05-22 NOTE — Op Note (Signed)
Patient:  Penny Garza  DOB:  04/16/1971  MRN:  [id number]   Preop Diagnosis: Ventral hernias, partial small bowel obstruction  Postop Diagnosis: Same  Procedure: Ventral herniorrhaphy with mesh, lysis of adhesions  Surgeon: Aviva Signs, MD  Anes: General endotracheal  Indications: Patient is a 50 year old black female status post total abdominal hysterectomy on 05/01/2021 who presented to the hospital with worsening nausea and vomiting.  She was noted on CT scan of the abdomen to have 2 ventral hernias as well as a possible adhesion with transition zone in the lower abdomen.  She now presents for exploratory laparotomy with repair of her ventral hernias.  The risks and benefits of the procedure including bleeding, infection, bowel resection, mesh use, the possibility of recurrence of the hernia were fully explained to the patient, who gave informed consent.  Procedure note: The patient was placed in supine position.  After induction of general endotracheal anesthesia, the abdomen was prepped and draped using the usual sterile technique with ChloraPrep.  Surgical site confirmation was performed.  Midline incision was made from just above the umbilicus to below the umbilicus.  The peritoneal cavity was entered into without difficulty.  There were 2 areas of falciform ligament and omental herniation just superior to the umbilicus and at the umbilicus.  I did excise excess omentum and the falciform ligament using the LigaSure.  The proximal small bowel was somewhat dilated.  I ended up running the small bowel from the ligament of Treitz to the terminal ileum.  In the distal small bowel, there were multiple areas of adhesion down into the left pelvis as well as an adhesion of small bowel along the midline just superior to the previous Pfannenstiel incision.  The bowel distal to this was somewhat decompressed.  I suspect this was a partial small bowel obstruction.  I think this was a previous hernia  defect as the bowel was significantly adhesed to the anterior abdominal wall.  All of these adhesions were lysed sharply with scissors.  There were 2 areas of serosal tears in the distal small bowel and these were repaired using 3-0 silk Lembert sutures.  There was no perforation noted.  No compromise of the small bowel lumen was noted.  The small bowel was then again run retrograde from the terminal ileum to the ligament of Treitz.  It was returned into the abdominal cavity in an orderly fashion.  A 15 x 20 cm Ventralight DualMesh was then placed and tacked to the anterior abdominal wall circumferentially using 0 Prolene interrupted sutures.  Care was taken to avoid the bowel beneath it.  The fascia overlying the mesh was then closed using a running looped 0 Novafil suture.  Xaracoll lidocaine strips were then placed over the mesh and in the subcutaneous tissue.  The wound was irrigated with normal saline.  The skin was closed using staples.  Betadine ointment and dry sterile dressing were applied.  All tape and needle counts were correct at the end of the procedure.  Patient was extubated in the operating room and transferred to PACU in stable condition.  Complications: None  EBL: 25 cc  Specimen: None

## 2021-05-22 NOTE — Anesthesia Preprocedure Evaluation (Addendum)
Anesthesia Evaluation  Patient identified by MRN, date of birth, ID band Patient awake    Reviewed: Allergy & Precautions, NPO status , Patient's Chart, lab work & pertinent test results  History of Anesthesia Complications (+) history of anesthetic complications  Airway Mallampati: II  TM Distance: >3 FB Neck ROM: Full    Dental  (+) Dental Advisory Given, Teeth Intact   Pulmonary neg pulmonary ROS,    Pulmonary exam normal breath sounds clear to auscultation       Cardiovascular Exercise Tolerance: Good  Rhythm:Regular Rate:Normal + Systolic murmurs    Neuro/Psych PSYCHIATRIC DISORDERS Depression negative neurological ROS     GI/Hepatic negative GI ROS, Neg liver ROS,   Endo/Other  Morbid obesity  Renal/GU negative Renal ROS     Musculoskeletal negative musculoskeletal ROS (+)   Abdominal   Peds  Hematology  (+) anemia ,   Anesthesia Other Findings   Reproductive/Obstetrics negative OB ROS                           Anesthesia Physical Anesthesia Plan  ASA: 3  Anesthesia Plan: General   Post-op Pain Management:    Induction: Intravenous and Rapid sequence  PONV Risk Score and Plan: Ondansetron, Midazolam and Dexamethasone  Airway Management Planned: Oral ETT  Additional Equipment:   Intra-op Plan:   Post-operative Plan: Extubation in OR  Informed Consent: I have reviewed the patients History and Physical, chart, labs and discussed the procedure including the risks, benefits and alternatives for the proposed anesthesia with the patient or authorized representative who has indicated his/her understanding and acceptance.       Plan Discussed with:   Anesthesia Plan Comments:         Anesthesia Quick Evaluation

## 2021-05-23 ENCOUNTER — Encounter (HOSPITAL_COMMUNITY): Payer: Self-pay | Admitting: General Surgery

## 2021-05-23 LAB — COMPREHENSIVE METABOLIC PANEL
ALT: 67 U/L — ABNORMAL HIGH (ref 0–44)
AST: 66 U/L — ABNORMAL HIGH (ref 15–41)
Albumin: 3.7 g/dL (ref 3.5–5.0)
Alkaline Phosphatase: 130 U/L — ABNORMAL HIGH (ref 38–126)
Anion gap: 10 (ref 5–15)
BUN: 8 mg/dL (ref 6–20)
CO2: 25 mmol/L (ref 22–32)
Calcium: 9.2 mg/dL (ref 8.9–10.3)
Chloride: 102 mmol/L (ref 98–111)
Creatinine, Ser: 0.89 mg/dL (ref 0.44–1.00)
GFR, Estimated: >60 mL/min (ref >60)
Glucose, Bld: 120 mg/dL — ABNORMAL HIGH (ref 70–99)
Potassium: 3.8 mmol/L (ref 3.5–5.1)
Sodium: 137 mmol/L (ref 135–145)
Total Bilirubin: 2.1 mg/dL — ABNORMAL HIGH (ref 0.3–1.2)
Total Protein: 7.5 g/dL (ref 6.5–8.1)

## 2021-05-23 LAB — CBC
HCT: 37.3 % (ref 36.0–46.0)
Hemoglobin: 10.9 g/dL — ABNORMAL LOW (ref 12.0–15.0)
MCH: 27.7 pg (ref 26.0–34.0)
MCHC: 29.2 g/dL — ABNORMAL LOW (ref 30.0–36.0)
MCV: 94.9 fL (ref 80.0–100.0)
Platelets: 358 10*3/uL (ref 150–400)
RBC: 3.93 MIL/uL (ref 3.87–5.11)
RDW: 15.8 % — ABNORMAL HIGH (ref 11.5–15.5)
WBC: 8 10*3/uL (ref 4.0–10.5)
nRBC: 0 % (ref 0.0–0.2)

## 2021-05-23 LAB — MAGNESIUM: Magnesium: 1.8 mg/dL (ref 1.7–2.4)

## 2021-05-23 MED ORDER — METOCLOPRAMIDE HCL 5 MG/ML IJ SOLN
10.0000 mg | Freq: Once | INTRAMUSCULAR | Status: AC
  Administered 2021-05-23: 10 mg via INTRAVENOUS
  Filled 2021-05-23: qty 2

## 2021-05-23 MED ORDER — FENTANYL CITRATE (PF) 100 MCG/2ML IJ SOLN
25.0000 ug | Freq: Once | INTRAMUSCULAR | Status: AC
  Administered 2021-05-23: 25 ug via INTRAVENOUS
  Filled 2021-05-23: qty 2

## 2021-05-23 MED ORDER — ACETAMINOPHEN 325 MG PO TABS
650.0000 mg | ORAL_TABLET | Freq: Four times a day (QID) | ORAL | Status: DC | PRN
  Administered 2021-05-23: 650 mg via ORAL
  Filled 2021-05-23: qty 2

## 2021-05-23 MED ORDER — METRONIDAZOLE 500 MG/100ML IV SOLN
500.0000 mg | Freq: Three times a day (TID) | INTRAVENOUS | Status: DC
  Administered 2021-05-24 – 2021-05-27 (×11): 500 mg via INTRAVENOUS
  Filled 2021-05-23 (×11): qty 100

## 2021-05-23 MED ORDER — MAGNESIUM HYDROXIDE 400 MG/5ML PO SUSP: 30.0000 mL | Freq: Three times a day (TID) | ORAL | Status: DC | PRN

## 2021-05-23 MED ORDER — SODIUM CHLORIDE 0.9 % IV SOLN
2.0000 g | INTRAVENOUS | Status: DC
  Administered 2021-05-24 – 2021-05-27 (×4): 2 g via INTRAVENOUS
  Filled 2021-05-23 (×4): qty 20

## 2021-05-23 MED ORDER — LACTATED RINGERS IV BOLUS (SEPSIS)
1000.0000 mL | Freq: Once | INTRAVENOUS | Status: AC
  Administered 2021-05-23: 1000 mL via INTRAVENOUS

## 2021-05-23 MED ORDER — LACTATED RINGERS IV BOLUS (SEPSIS): 1000.0000 mL | Freq: Once | INTRAVENOUS | Status: DC

## 2021-05-23 MED ORDER — BISACODYL 10 MG RE SUPP
10.0000 mg | Freq: Once | RECTAL | Status: AC
  Administered 2021-05-23: 10 mg via RECTAL
  Filled 2021-05-23: qty 1

## 2021-05-23 NOTE — Progress Notes (Signed)
   05/23/21 2259  Vitals  Temp (!) 101.2 F (38.4 C)  Temp Source Oral  BP 115/74  MAP (mmHg) 87  BP Location Right Arm  BP Method Automatic  Patient Position (if appropriate) Lying  Pulse Rate (!) 115  Pulse Rate Source Monitor;Dinamap  Resp 20  MEWS COLOR  MEWS Score Color Yellow  Oxygen Therapy  SpO2 98 %  O2 Device Nasal Cannula  O2 Flow Rate (L/min) 1 L/min  MEWS Score  MEWS Temp 1  MEWS Systolic 0  MEWS Pulse 2  MEWS RR 0  MEWS LOC 0  MEWS Score 3   MD Olevia Bowens notified.

## 2021-05-23 NOTE — Progress Notes (Signed)
PROGRESS NOTE    Penny Garza  LSL:[id number] DOB: Sep 17, 1971 DOA: 05/20/2021 PCP: [facility]   Brief Narrative:   Penny Garza is a 50 y.o. female with no significant past medical history presents to the ER for the second time in 5 days with complaints of nausea vomiting abdominal discomfort.  Patient was admitted with intractable nausea and vomiting with possible partial small bowel obstruction.  She is also noted to have some mild transaminitis with right upper quadrant ultrasound demonstrating some cholelithiasis and gallbladder sludge.  Patient has undergone ventral herniorrhaphy with mesh on 7/6 and continues to have some pain and cramping this morning.  Assessment & Plan:   Principal Problem:   Nausea & vomiting Active Problems:   S/P TAH-BSO (total abdominal hysterectomy and bilateral salpingo-oophorectomy)   Abdominal pain   Elevated LFTs   Ventral hernia with obstruction and without gangrene   Small bowel obstruction due to adhesions (HCC)   Intractable nausea and vomiting with possible SBO-resolved -Status post ventral herniorrhaphy on 7/6 -Appreciate ongoing gynecology evaluation -Started on diet -Patient needs to ambulate more and have better pain control prior to discharge per general surgery.  Anticipate possible discharge 7/8   Mild transaminitis-stabilized -Can be followed further outpatient -Noted to have gallbladder sludge with cholelithiasis -Appreciate GI recommendations for follow-up in the outpatient setting in 4 weeks   Recent total abdominal hysterectomy -Appreciate gynecology recommendations   Anemia -Hemoglobin remaining stable -No overt bleeding identified   Morbid obesity -Lifestyle changes outpatient     DVT prophylaxis: SCDs Code Status: Full Family Communication: None at bedside Disposition Plan:  Status is: Inpatient   Remains inpatient appropriate because:IV treatments appropriate due to intensity of illness or inability to take  PO and Inpatient level of care appropriate due to severity of illness   Dispo: The patient is from: Home              Anticipated d/c is to: Home              Patient currently is not medically stable to d/c.              Difficult to place patient No     Consultants:  General surgery GI Gynecology   Procedures:  See below   Antimicrobials:  None   Subjective: Patient seen and evaluated today with abdominal cramping pain and difficulty with ambulating.  Objective: Vitals:   05/22/21 1741 05/22/21 2058 05/23/21 0159 05/23/21 0523  BP: (!) 147/84 (!) 155/83 (!) 146/86 (!) 152/95  Pulse: 80 88 86 83  Resp: 20 20 19  (!) 21  Temp: 98.4 F (36.9 C) 98.8 F (37.1 C) 98.2 F (36.8 C) 99.4 F (37.4 C)  TempSrc: Oral Oral Oral Oral  SpO2: 100% 99% 98% 100%    Intake/Output Summary (Last 24 hours) at 05/23/2021 1157 Last data filed at 05/22/2021 1606 Gross per 24 hour  Intake 1700 ml  Output 25 ml  Net 1675 ml   There were no vitals filed for this visit.  Examination:  General exam: Appears calm, but with minimal distress, obese Respiratory system: Clear to auscultation. Respiratory effort normal. Cardiovascular system: S1 & S2 heard, RRR.  Gastrointestinal system: Abdomen with binder present.  Incisions clean dry and intact. Central nervous system: Alert and awake Extremities: No edema Skin: No significant lesions noted Psychiatry: Flat affect.    Data Reviewed: I have personally reviewed following labs and imaging studies  CBC: Recent Labs  Lab 05/20/21 1652 05/21/21  1610 05/22/21 0427 05/23/21 0619  WBC 5.5 4.5 4.5 8.0  NEUTROABS 3.8 2.7  --   --   HGB 10.4* 9.4* 9.6* 10.9*  HCT 34.8* 31.9* 32.8* 37.3  MCV 93.8 94.4 96.5 94.9  PLT 405* 364 375 960   Basic Metabolic Panel: Recent Labs  Lab 05/20/21 1652 05/21/21 0645 05/22/21 0427 05/23/21 0619  NA 141 140 141 137  K 3.4* 3.2* 3.4* 3.8  CL 105 107 107 102  CO2 25 25 23 25   GLUCOSE 95 110* 93  120*  BUN 9 8 8 8   CREATININE 0.75 0.63 0.57 0.89  CALCIUM 9.5 8.9 9.4 9.2  MG  --   --  2.0 1.8   GFR: Estimated Creatinine Clearance: 103 mL/min (by C-G formula based on SCr of 0.89 mg/dL). Liver Function Tests: Recent Labs  Lab 05/20/21 1652 05/21/21 0645 05/22/21 0427 05/23/21 0619  AST 84* 70* 79* 66*  ALT 80* 73* 80* 67*  ALKPHOS 128* 120 130* 130*  BILITOT 1.8* 2.0* 1.8* 2.1*  PROT 8.4* 7.2 7.6 7.5  ALBUMIN 4.0 3.6 3.7 3.7   Recent Labs  Lab 05/20/21 1652  LIPASE 20   No results for input(s): AMMONIA in the last 168 hours. Coagulation Profile: No results for input(s): INR, PROTIME in the last 168 hours. Cardiac Enzymes: No results for input(s): CKTOTAL, CKMB, CKMBINDEX, TROPONINI in the last 168 hours. BNP (last 3 results) No results for input(s): PROBNP in the last 8760 hours. HbA1C: No results for input(s): HGBA1C in the last 72 hours. CBG: No results for input(s): GLUCAP in the last 168 hours. Lipid Profile: No results for input(s): CHOL, HDL, LDLCALC, TRIG, CHOLHDL, LDLDIRECT in the last 72 hours. Thyroid Function Tests: No results for input(s): TSH, T4TOTAL, FREET4, T3FREE, THYROIDAB in the last 72 hours. Anemia Panel: No results for input(s): VITAMINB12, FOLATE, FERRITIN, TIBC, IRON, RETICCTPCT in the last 72 hours. Sepsis Labs: No results for input(s): PROCALCITON, LATICACIDVEN in the last 168 hours.  Recent Results (from the past 240 hour(s))  Resp Panel by RT-PCR (Flu A&B, Covid) Nasopharyngeal Swab     Status: None   Collection Time: 05/20/21 10:34 PM   Specimen: Nasopharyngeal Swab; Nasopharyngeal(NP) swabs in vial transport medium  Result Value Ref Range Status   SARS Coronavirus 2 by RT PCR NEGATIVE NEGATIVE Final    Comment: (NOTE) SARS-CoV-2 target nucleic acids are NOT DETECTED.  The SARS-CoV-2 RNA is generally detectable in upper respiratory specimens during the acute phase of infection. The lowest concentration of SARS-CoV-2 viral  copies this assay can detect is 138 copies/mL. A negative result does not preclude SARS-Cov-2 infection and should not be used as the sole basis for treatment or other patient management decisions. A negative result may occur with  improper specimen collection/handling, submission of specimen other than nasopharyngeal swab, presence of viral mutation(s) within the areas targeted by this assay, and inadequate number of viral copies(<138 copies/mL). A negative result must be combined with clinical observations, patient history, and epidemiological information. The expected result is Negative.  Fact Sheet for Patients:  EntrepreneurPulse.com.au  Fact Sheet for Healthcare Providers:  IncredibleEmployment.be  This test is no t yet approved or cleared by the [place] FDA and  has been authorized for detection and/or diagnosis of SARS-CoV-2 by FDA under an Emergency Use Authorization (EUA). This EUA will remain  in effect (meaning this test can be used) for the duration of the COVID-19 declaration under Section 564(b)(1) of the Act, 21 U.S.C.section 360bbb-3(b)(1), unless  the authorization is terminated  or revoked sooner.       Influenza A by PCR NEGATIVE NEGATIVE Final   Influenza B by PCR NEGATIVE NEGATIVE Final    Comment: (NOTE) The Xpert Xpress SARS-CoV-2/FLU/RSV plus assay is intended as an aid in the diagnosis of influenza from Nasopharyngeal swab specimens and should not be used as a sole basis for treatment. Nasal washings and aspirates are unacceptable for Xpert Xpress SARS-CoV-2/FLU/RSV testing.  Fact Sheet for Patients: EntrepreneurPulse.com.au  Fact Sheet for Healthcare Providers: IncredibleEmployment.be  This test is not yet approved or cleared by the [place] FDA and has been authorized for detection and/or diagnosis of SARS-CoV-2 by FDA under an Emergency Use Authorization (EUA). This  EUA will remain in effect (meaning this test can be used) for the duration of the COVID-19 declaration under Section 564(b)(1) of the Act, 21 U.S.C. section 360bbb-3(b)(1), unless the authorization is terminated or revoked.  Performed at [facility], [address], [place], [place] [address]   Surgical PCR screen     Status: None   Collection Time: 05/22/21 12:53 AM   Specimen: Nasal Mucosa; Nasal Swab  Result Value Ref Range Status   MRSA, PCR NEGATIVE NEGATIVE Final   Staphylococcus aureus NEGATIVE NEGATIVE Final    Comment: (NOTE) The Xpert SA Assay (FDA approved for NASAL specimens in patients 88 years of age and older), is one component of a comprehensive surveillance program. It is not intended to diagnose infection nor to guide or monitor treatment. Performed at [facility], [address] [place], [place] [address]          Radiology Studies: US Abdomen Limited RUQ (LIVER/GB)  Result Date: 05/21/2021 CLINICAL DATA:  Elevated LFTs. EXAM: ULTRASOUND ABDOMEN LIMITED RIGHT UPPER QUADRANT COMPARISON:  CT 05/20/2021. FINDINGS: Gallbladder: Gallstones and sludge noted. Gallbladder wall thickness 1.9 mm. Negative Murphy sign. Common bile duct: Diameter: 2.9 mm Liver: Increased echogenicity consistent with fatty infiltration or hepatocellular disease. No focal hepatic abnormality identified. Portal vein is patent on color Doppler imaging with normal direction of blood flow towards the liver. Other: None. IMPRESSION: 1. Gallstones and sludge noted. No evidence of cholecystitis or biliary distention. 2. Increased hepatic echogenicity consistent fatty infiltration or hepatocellular disease. Electronically Signed   By: Marcello Moores  Register   On: 05/21/2021 15:32        Scheduled Meds:  enoxaparin (LOVENOX) injection  60 mg Subcutaneous Q24H   fentaNYL (SUBLIMAZE) injection  50 mcg Intravenous Once   ondansetron (ZOFRAN) IV  4 mg Intravenous Once     LOS: 3 days    Time spent: 35  minutes    Yohan Samons Darleen Crocker, DO [facility]  If 7PM-7AM, please contact night-coverage [email or web address] 05/23/2021, 9:21 AM

## 2021-05-23 NOTE — Progress Notes (Signed)
[facility] Progress Note  1 Day Post-Op  Subjective: Penny Garza is a 50 year old female status post-op day 1 following a ventral hernia repair with mesh and lysis of adhesions. She is doing well this morning. Her main complaint is left-sided, continuous cramping abdominal pain. She is not having diffuse abdominal pain, but she says she felt her "insides were sloshing inside" when she got up to use the restroom. She was able to urinate 2-3 times during the night, and sitting on the toilet relieved her pain temporarily. She has been able to drink the juices and water she was given.  Objective: Vital signs in last 24 hours: Temp:  [98.1 F (36.7 C)-99.4 F (37.4 C)] 99.4 F (37.4 C) (07/07 0523) Pulse Rate:  [65-88] 83 (07/07 0523) Resp:  [13-32] 21 (07/07 0523) BP: (146-165)/(77-95) 152/95 (07/07 0523) SpO2:  [97 %-100 %] 100 % (07/07 0523) Last BM Date: 05/21/21  Intake/Output from previous day: 07/06 0701 - 07/07 0700 In: 1700 [I.V.:1600; IV Piggyback:100] Out: 25 [Blood:25] Intake/Output this shift: No intake/output data recorded.  General appearance: alert, cooperative, and no distress Head: Normocephalic, without obvious abnormality, atraumatic Eyes: conjunctivae/corneas clear. PERRL, EOM's intact. Fundi benign. Resp: normal work of breathing Cardio: regular rate and rhythm GI: no diffuse abdominal pain but discomfort on the left side with palpation Incision/Wound:some bleeding underneath honeycomb dressing, but incision site is covered well and intact  Lab Results:  Recent Labs    05/22/21 0427 05/23/21 0619  WBC 4.5 8.0  HGB 9.6* 10.9*  HCT 32.8* 37.3  PLT 375 358   BMET Recent Labs    05/22/21 0427 05/23/21 0619  NA 141 137  K 3.4* 3.8  CL 107 102  CO2 23 25  GLUCOSE 93 120*  BUN 8 8  CREATININE 0.57 0.89  CALCIUM 9.4 9.2   PT/INR No results for input(s): LABPROT, INR in the last 72 hours.  Studies/Results: US Abdomen Limited RUQ  (LIVER/GB)  Result Date: 05/21/2021 CLINICAL DATA:  Elevated LFTs. EXAM: ULTRASOUND ABDOMEN LIMITED RIGHT UPPER QUADRANT COMPARISON:  CT 05/20/2021. FINDINGS: Gallbladder: Gallstones and sludge noted. Gallbladder wall thickness 1.9 mm. Negative Murphy sign. Common bile duct: Diameter: 2.9 mm Liver: Increased echogenicity consistent with fatty infiltration or hepatocellular disease. No focal hepatic abnormality identified. Portal vein is patent on color Doppler imaging with normal direction of blood flow towards the liver. Other: None. IMPRESSION: 1. Gallstones and sludge noted. No evidence of cholecystitis or biliary distention. 2. Increased hepatic echogenicity consistent fatty infiltration or hepatocellular disease. Electronically Signed   By: Marcello Moores  Register   On: 05/21/2021 15:32    Anti-infectives: Anti-infectives (From admission, onward)    Start     Dose/Rate Route Frequency Ordered Stop   05/23/21 0600  ceFAZolin (ANCEF) IVPB 3g/100 mL premix        3 g 200 mL/hr over 30 Minutes Intravenous On call to O.R. 05/22/21 1225 05/22/21 1501   05/22/21 1332  ceFAZolin (ANCEF) 3-0.9 GM/100ML-% IVPB       Note to Pharmacy: Abbie Sons   : cabinet override      05/22/21 1332 05/22/21 1434       Assessment/Plan: s/p Procedure(s): VENTRAL HERNIORRHAPHY WITH MESH LYSIS OF ADHESIONS   LOS: 3 days   Penny Garza is a 50 year old female status post-op day 1 following a ventral hernia repair with mesh and lysis of adhesions.  She is generally doing well, so we will resume with thin liquids diet for now and continue giving 30mg /mL  Toradol injections every 6 hours PRN for pain. She may be ready for discharge tomorrow.   Danie Chandler 05/23/2021

## 2021-05-24 ENCOUNTER — Inpatient Hospital Stay (HOSPITAL_COMMUNITY): Payer: 59

## 2021-05-24 DIAGNOSIS — K436 Other and unspecified ventral hernia with obstruction, without gangrene: Principal | ICD-10-CM

## 2021-05-24 DIAGNOSIS — K9189 Other postprocedural complications and disorders of digestive system: Secondary | ICD-10-CM

## 2021-05-24 DIAGNOSIS — R1031 Right lower quadrant pain: Secondary | ICD-10-CM

## 2021-05-24 DIAGNOSIS — K567 Ileus, unspecified: Secondary | ICD-10-CM

## 2021-05-24 LAB — COMPREHENSIVE METABOLIC PANEL
ALT: 46 U/L — ABNORMAL HIGH (ref 0–44)
ALT: 53 U/L — ABNORMAL HIGH (ref 0–44)
AST: 26 U/L (ref 15–41)
AST: 34 U/L (ref 15–41)
Albumin: 3.5 g/dL (ref 3.5–5.0)
Albumin: 3.7 g/dL (ref 3.5–5.0)
Alkaline Phosphatase: 120 U/L (ref 38–126)
Alkaline Phosphatase: 130 U/L — ABNORMAL HIGH (ref 38–126)
Anion gap: 10 (ref 5–15)
Anion gap: 9 (ref 5–15)
BUN: 17 mg/dL (ref 6–20)
BUN: 17 mg/dL (ref 6–20)
CO2: 23 mmol/L (ref 22–32)
CO2: 26 mmol/L (ref 22–32)
Calcium: 8.8 mg/dL — ABNORMAL LOW (ref 8.9–10.3)
Calcium: 8.9 mg/dL (ref 8.9–10.3)
Chloride: 102 mmol/L (ref 98–111)
Chloride: 104 mmol/L (ref 98–111)
Creatinine, Ser: 0.85 mg/dL (ref 0.44–1.00)
Creatinine, Ser: 0.98 mg/dL (ref 0.44–1.00)
GFR, Estimated: >60 mL/min (ref >60)
GFR, Estimated: >60 mL/min (ref >60)
Glucose, Bld: 121 mg/dL — ABNORMAL HIGH (ref 70–99)
Glucose, Bld: 121 mg/dL — ABNORMAL HIGH (ref 70–99)
Potassium: 3.2 mmol/L — ABNORMAL LOW (ref 3.5–5.1)
Potassium: 3.7 mmol/L (ref 3.5–5.1)
Sodium: 135 mmol/L (ref 135–145)
Sodium: 139 mmol/L (ref 135–145)
Total Bilirubin: 2.4 mg/dL — ABNORMAL HIGH (ref 0.3–1.2)
Total Bilirubin: 2.9 mg/dL — ABNORMAL HIGH (ref 0.3–1.2)
Total Protein: 7.5 g/dL (ref 6.5–8.1)
Total Protein: 8.1 g/dL (ref 6.5–8.1)

## 2021-05-24 LAB — CBC WITH DIFFERENTIAL/PLATELET
Abs Immature Granulocytes: 0.03 10*3/uL (ref 0.00–0.07)
Abs Immature Granulocytes: 0.04 10*3/uL (ref 0.00–0.07)
Basophils Absolute: 0 10*3/uL (ref 0.0–0.1)
Basophils Absolute: 0 10*3/uL (ref 0.0–0.1)
Basophils Relative: 0 %
Basophils Relative: 0 %
Eosinophils Absolute: 0.1 10*3/uL (ref 0.0–0.5)
Eosinophils Absolute: 0.1 10*3/uL (ref 0.0–0.5)
Eosinophils Relative: 1 %
Eosinophils Relative: 1 %
HCT: 35.2 % — ABNORMAL LOW (ref 36.0–46.0)
HCT: 36.1 % (ref 36.0–46.0)
Hemoglobin: 10.5 g/dL — ABNORMAL LOW (ref 12.0–15.0)
Hemoglobin: 10.9 g/dL — ABNORMAL LOW (ref 12.0–15.0)
Immature Granulocytes: 0 %
Immature Granulocytes: 1 %
Lymphocytes Relative: 13 %
Lymphocytes Relative: 14 %
Lymphs Abs: 1.1 10*3/uL (ref 0.7–4.0)
Lymphs Abs: 1.2 10*3/uL (ref 0.7–4.0)
MCH: 27.7 pg (ref 26.0–34.0)
MCH: 27.9 pg (ref 26.0–34.0)
MCHC: 29.8 g/dL — ABNORMAL LOW (ref 30.0–36.0)
MCHC: 30.2 g/dL (ref 30.0–36.0)
MCV: 91.9 fL (ref 80.0–100.0)
MCV: 93.4 fL (ref 80.0–100.0)
Monocytes Absolute: 0.7 10*3/uL (ref 0.1–1.0)
Monocytes Absolute: 0.7 10*3/uL (ref 0.1–1.0)
Monocytes Relative: 8 %
Monocytes Relative: 9 %
Neutro Abs: 6 10*3/uL (ref 1.7–7.7)
Neutro Abs: 7 10*3/uL (ref 1.7–7.7)
Neutrophils Relative %: 75 %
Neutrophils Relative %: 78 %
Platelets: 383 10*3/uL (ref 150–400)
Platelets: 398 10*3/uL (ref 150–400)
RBC: 3.77 MIL/uL — ABNORMAL LOW (ref 3.87–5.11)
RBC: 3.93 MIL/uL (ref 3.87–5.11)
RDW: 16.1 % — ABNORMAL HIGH (ref 11.5–15.5)
RDW: 16.2 % — ABNORMAL HIGH (ref 11.5–15.5)
WBC: 8 10*3/uL (ref 4.0–10.5)
WBC: 9.1 10*3/uL (ref 4.0–10.5)
nRBC: 0 % (ref 0.0–0.2)
nRBC: 0 % (ref 0.0–0.2)

## 2021-05-24 LAB — PHOSPHORUS: Phosphorus: 3.1 mg/dL (ref 2.5–4.6)

## 2021-05-24 LAB — PROCALCITONIN: Procalcitonin: <0.1 ng/mL

## 2021-05-24 LAB — PROTIME-INR
INR: 1.2 (ref 0.8–1.2)
Prothrombin Time: 15 seconds (ref 11.4–15.2)

## 2021-05-24 LAB — APTT: aPTT: 29 seconds (ref 24–36)

## 2021-05-24 LAB — MAGNESIUM: Magnesium: 1.8 mg/dL (ref 1.7–2.4)

## 2021-05-24 LAB — LACTIC ACID, PLASMA: Lactic Acid, Venous: 1.3 mmol/L (ref 0.5–1.9)

## 2021-05-24 MED ORDER — MAGNESIUM SULFATE 2 GM/50ML IV SOLN
2.0000 g | Freq: Once | INTRAVENOUS | Status: AC
  Administered 2021-05-24: 2 g via INTRAVENOUS
  Filled 2021-05-24: qty 50

## 2021-05-24 MED ORDER — POTASSIUM CHLORIDE IN NACL 20-0.9 MEQ/L-% IV SOLN: INTRAVENOUS | Status: DC

## 2021-05-24 MED ORDER — FAMOTIDINE IN NACL 20-0.9 MG/50ML-% IV SOLN
20.0000 mg | Freq: Once | INTRAVENOUS | Status: AC
  Administered 2021-05-24: 20 mg via INTRAVENOUS
  Filled 2021-05-24: qty 50

## 2021-05-24 MED ORDER — POTASSIUM CHLORIDE IN NACL 40-0.9 MEQ/L-% IV SOLN: INTRAVENOUS | Status: AC

## 2021-05-24 MED ORDER — LIDOCAINE HCL URETHRAL/MUCOSAL 2 % EX GEL
1.0000 "application " | Freq: Once | CUTANEOUS | Status: AC
  Administered 2021-05-24: 1
  Filled 2021-05-24: qty 10

## 2021-05-24 MED ORDER — IPRATROPIUM-ALBUTEROL 0.5-2.5 (3) MG/3ML IN SOLN: 3.0000 mL | RESPIRATORY_TRACT | Status: DC | PRN

## 2021-05-24 MED ORDER — POTASSIUM CHLORIDE IN NACL 20-0.9 MEQ/L-% IV SOLN: INTRAVENOUS | Status: AC

## 2021-05-24 MED ORDER — SODIUM CHLORIDE 0.9 % IV SOLN: INTRAVENOUS | Status: DC | PRN

## 2021-05-24 MED ORDER — PANTOPRAZOLE SODIUM 40 MG IV SOLR
40.0000 mg | Freq: Two times a day (BID) | INTRAVENOUS | Status: DC
  Administered 2021-05-24 – 2021-05-29 (×10): 40 mg via INTRAVENOUS
  Filled 2021-05-24 (×10): qty 40

## 2021-05-24 NOTE — Progress Notes (Signed)
Patient began to feel nauseous and had small amount of vomit. Patient was given Zofran at 2000. Patient emesis was noted to be green/clear. Patient pain 6/10 on her L side. Patient abdomen was very tender to touch. Drainage noted on honeycomb dressing. Patient expressed that her R lower side felt hard and tender. Patient stated this was a new feeling that just started when she began vomiting. MD Olevia Bowens was made aware.   At 2058 patient vomited a large amount and had to be cleaned up.   MD Olevia Bowens came to see patient at the bedside.   Will continue to monitor.

## 2021-05-24 NOTE — Progress Notes (Signed)
[facility] night shift.  The nursing staff reported that patient is complaining of cough.  She had an NG tube placed earlier today.  DuoNebs as needed and a portable chest radiograph were ordered.  Chest radiograph was clear.  Addendum: 0813  He does nursing staff reported that the patient is just draining from the NG tube intermittently.  A portable abdominal x-ray confirmed that the tube is in the expected position in the stomach.  I asked the staff to try to reposition it.  Tennis Must, MD.

## 2021-05-24 NOTE — Progress Notes (Signed)
[facility] Progress Note  2 Days Post-Op  Subjective: Penny Garza is a 50 year old female status post-op day 2 following a ventral hernia repair with mesh and lysis of adhesions. She has had multiple episodes of bilious emesis without attempting to consume po. She just was sitting up and had her abd binder readjusted.  She had a small bowel movement, in response to suppository.   Objective: Vital signs in last 24 hours: Temp:  [98.3 F (36.8 C)-101.2 F (38.4 C)] 98.9 F (37.2 C) (07/08 0501) Pulse Rate:  [99-115] 102 (07/08 0501) Resp:  [18-20] 18 (07/08 0501) BP: (97-140)/(63-74) 140/64 (07/08 0501) SpO2:  [98 %-100 %] 99 % (07/08 0501) Last BM Date: 05/21/21  Intake/Output from previous day: 07/07 0701 - 07/08 0700 In: 3131.4 [P.O.:480; I.V.:1474; IV Piggyback:1177.4] Out: -  Intake/Output this shift: No intake/output data recorded.  General appearance: alert, cooperative, and no distress Head: Normocephalic, without obvious abnormality, atraumatic Eyes: conjunctivae/corneas clear. PERRL, EOM's intact. Fundi benign. Resp: normal work of breathing Cardio: regular rate and rhythm GI: no diffuse abdominal tenderness aside from her incision.   Incision/Wound:some bleeding underneath honeycomb dressing, but incision site is covered well and intact  Lab Results:  Recent Labs    05/23/21 2333 05/24/21 0608  WBC 9.1 8.0  HGB 10.9* 10.5*  HCT 36.1 35.2*  PLT 398 383    BMET Recent Labs    05/23/21 2333 05/24/21 0608  NA 135 139  K 3.2* 3.7  CL 102 104  CO2 23 26  GLUCOSE 121* 121*  BUN 17 17  CREATININE 0.98 0.85  CALCIUM 8.8* 8.9    PT/INR Recent Labs    05/23/21 2333  LABPROT 15.0  INR 1.2    Studies/Results: No results found.  Anti-infectives: Anti-infectives (From admission, onward)    Start     Dose/Rate Route Frequency Ordered Stop   05/24/21 0000  cefTRIAXone (ROCEPHIN) 2 g in sodium chloride 0.9 % 100 mL IVPB        2  g 200 mL/hr over 30 Minutes Intravenous Every 24 hours 05/23/21 2317     05/24/21 0000  metroNIDAZOLE (FLAGYL) IVPB 500 mg        500 mg 100 mL/hr over 60 Minutes Intravenous Every 8 hours 05/23/21 2317     05/23/21 0600  ceFAZolin (ANCEF) IVPB 3g/100 mL premix        3 g 200 mL/hr over 30 Minutes Intravenous On call to O.R. 05/22/21 1225 05/22/21 1501   05/22/21 1332  ceFAZolin (ANCEF) 3-0.9 GM/100ML-% IVPB       Note to Pharmacy: Abbie Sons   : cabinet override      05/22/21 1332 05/22/21 1434       Assessment/Plan: s/p Procedure(s): VENTRAL HERNIORRHAPHY WITH MESH LYSIS OF ADHESIONS  Post-operative ileus.  Will place NGT to LWS,  Reinstitute replacement IVF Ck KUB and continue serial exams.    LOS: 4 days      Ronny Bacon 05/24/2021

## 2021-05-24 NOTE — Progress Notes (Signed)
PROGRESS NOTE    Penny Garza  QTM:[id number] DOB: [birth year] DOA: 05/20/2021 PCP: [facility]   Brief Narrative:   Penny Garza is a 50 y.o. female with no significant past medical history presents to the ER for the second time in 5 days with complaints of nausea vomiting abdominal discomfort.  Patient was admitted with intractable nausea and vomiting with possible partial small bowel obstruction.  She is also noted to have some mild transaminitis with right upper quadrant ultrasound demonstrating some cholelithiasis and gallbladder sludge.  Patient has undergone ventral herniorrhaphy with mesh on 7/6 and continues to have abdominal pain, nausea and vomiting.  Assessment & Plan:   Principal Problem:   Nausea & vomiting Active Problems:   S/P TAH-BSO (total abdominal hysterectomy and bilateral salpingo-oophorectomy)   Abdominal pain   Elevated LFTs   Ventral hernia with obstruction and without gangrene   Small bowel obstruction due to adhesions (HCC)   Intractable nausea and vomiting with possible SBO and now postoperative ileus -Status post ventral herniorrhaphy on 7/6 -Appreciate ongoing assistance and postoperative care by general surgery -Patient needs to ambulate more and have better pain control prior to discharge per general surgery.   -Repeat abdominal x-ray demonstrating dilated small bowel loops suggesting ileus -NG tube has been replaced -Continue IV fluids, antiemetics and analgesics. -Follow electrolytes trend.     Mild transaminitis-stabilized -Can be followed further outpatient -Noted to have gallbladder sludge with cholelithiasis -Appreciate GI recommendations for follow-up in the outpatient setting in 4 weeks.   Recent total abdominal hysterectomy -Appreciate gynecology recommendations   Anemia -Hemoglobin remaining stable -No overt bleeding identified -Will continue monitoring CBC intermittently.   Morbid obesity -Low calorie diet, portion control and  increase physical activity.     DVT prophylaxis: SCDs Code Status: Full Family Communication: None at bedside Disposition Plan:  Status is: Inpatient   Remains inpatient appropriate because:IV treatments appropriate due to intensity of illness or inability to take PO and Inpatient level of care appropriate due to severity of illness   Dispo: The patient is from: Home              Anticipated d/c is to: Home              Patient currently is not medically stable to d/c.              Difficult to place patient No     Consultants:  General surgery GI Gynecology   Procedures:  See below   Antimicrobials:  None   Subjective: Low-grade temperature overnight; complaining of abdominal pain and ongoing intermittent episode of nausea and vomiting.  Patient in no distress.  Objective: Vitals:   05/24/21 0106 05/24/21 0323 05/24/21 0501 05/24/21 1359  BP: 119/63  140/64 121/78  Pulse: (!) 109  (!) 102 96  Resp: 20  18 20   Temp: 100.3 F (37.9 C) 98.3 F (36.8 C) 98.9 F (37.2 C) 99.5 F (37.5 C)  TempSrc: Oral Oral Oral Oral  SpO2: 100%  99% 99%    Intake/Output Summary (Last 24 hours) at 05/24/2021 1852 Last data filed at 05/24/2021 1834 Gross per 24 hour  Intake 2651.37 ml  Output --  Net 2651.37 ml   There were no vitals filed for this visit.  Examination: General exam: Alert, awake, oriented x 3; mild distress secondary to ongoing abdominal pain nausea and vomiting.  Patient experienced low-grade temperature overnight. Respiratory system: Clear to auscultation. Respiratory effort normal.  No requiring oxygen supplementation.  Cardiovascular system:RRR. No rubs or gallops. Unable to properly assess JVD with body habitus.. Gastrointestinal system: Abdomen is tender around incision area; binder in place, decreased bowel sounds.  No guarding.   Central nervous system: Alert and oriented. No focal neurological deficits. Extremities: No cyanosis or clubbing. Skin: No  petechiae. Psychiatry: Judgement and insight appear normal. Mood & affect appropriate.    Data Reviewed: I have personally reviewed following labs and imaging studies  CBC: Recent Labs  Lab 05/20/21 1652 05/21/21 0645 05/22/21 0427 05/23/21 0619 05/23/21 2333 05/24/21 0608  WBC 5.5 4.5 4.5 8.0 9.1 8.0  NEUTROABS 3.8 2.7  --   --  7.0 6.0  HGB 10.4* 9.4* 9.6* 10.9* 10.9* 10.5*  HCT 34.8* 31.9* 32.8* 37.3 36.1 35.2*  MCV 93.8 94.4 96.5 94.9 91.9 93.4  PLT 405* 364 375 358 398 454   Basic Metabolic Panel: Recent Labs  Lab 05/21/21 0645 05/22/21 0427 05/23/21 0619 05/23/21 2333 05/24/21 0608  NA 140 141 137 135 139  K 3.2* 3.4* 3.8 3.2* 3.7  CL 107 107 102 102 104  CO2 25 23 25 23 26   GLUCOSE 110* 93 120* 121* 121*  BUN 8 8 8 17 17   CREATININE 0.63 0.57 0.89 0.98 0.85  CALCIUM 8.9 9.4 9.2 8.8* 8.9  MG  --  2.0 1.8 1.8  --   PHOS  --   --   --  3.1  --    GFR: Estimated Creatinine Clearance: 107.9 mL/min (by C-G formula based on SCr of 0.85 mg/dL).  Liver Function Tests: Recent Labs  Lab 05/21/21 0645 05/22/21 0427 05/23/21 0619 05/23/21 2333 05/24/21 0608  AST 70* 79* 66* 34 26  ALT 73* 80* 67* 53* 46*  ALKPHOS 120 130* 130* 130* 120  BILITOT 2.0* 1.8* 2.1* 2.9* 2.4*  PROT 7.2 7.6 7.5 8.1 7.5  ALBUMIN 3.6 3.7 3.7 3.7 3.5   Recent Labs  Lab 05/20/21 1652  LIPASE 20   Coagulation Profile: Recent Labs  Lab 05/23/21 2333  INR 1.2    Sepsis Labs: Recent Labs  Lab 05/23/21 2333  PROCALCITON <0.10  LATICACIDVEN 1.3    Recent Results (from the past 240 hour(s))  Resp Panel by RT-PCR (Flu A&B, Covid) Nasopharyngeal Swab     Status: None   Collection Time: 05/20/21 10:34 PM   Specimen: Nasopharyngeal Swab; Nasopharyngeal(NP) swabs in vial transport medium  Result Value Ref Range Status   SARS Coronavirus 2 by RT PCR NEGATIVE NEGATIVE Final    Comment: (NOTE) SARS-CoV-2 target nucleic acids are NOT DETECTED.  The SARS-CoV-2 RNA is generally  detectable in upper respiratory specimens during the acute phase of infection. The lowest concentration of SARS-CoV-2 viral copies this assay can detect is 138 copies/mL. A negative result does not preclude SARS-Cov-2 infection and should not be used as the sole basis for treatment or other patient management decisions. A negative result may occur with  improper specimen collection/handling, submission of specimen other than nasopharyngeal swab, presence of viral mutation(s) within the areas targeted by this assay, and inadequate number of viral copies(<138 copies/mL). A negative result must be combined with clinical observations, patient history, and epidemiological information. The expected result is Negative.  Fact Sheet for Patients:  EntrepreneurPulse.com.au  Fact Sheet for Healthcare Providers:  IncredibleEmployment.be  This test is no t yet approved or cleared by the [place] FDA and  has been authorized for detection and/or diagnosis of SARS-CoV-2 by FDA under an Emergency Use Authorization (EUA). This EUA will  remain  in effect (meaning this test can be used) for the duration of the COVID-19 declaration under Section 564(b)(1) of the Act, 21 U.S.C.section 360bbb-3(b)(1), unless the authorization is terminated  or revoked sooner.       Influenza A by PCR NEGATIVE NEGATIVE Final   Influenza B by PCR NEGATIVE NEGATIVE Final    Comment: (NOTE) The Xpert Xpress SARS-CoV-2/FLU/RSV plus assay is intended as an aid in the diagnosis of influenza from Nasopharyngeal swab specimens and should not be used as a sole basis for treatment. Nasal washings and aspirates are unacceptable for Xpert Xpress SARS-CoV-2/FLU/RSV testing.  Fact Sheet for Patients: EntrepreneurPulse.com.au  Fact Sheet for Healthcare Providers: IncredibleEmployment.be  This test is not yet approved or cleared by the [place] FDA  and has been authorized for detection and/or diagnosis of SARS-CoV-2 by FDA under an Emergency Use Authorization (EUA). This EUA will remain in effect (meaning this test can be used) for the duration of the COVID-19 declaration under Section 564(b)(1) of the Act, 21 U.S.C. section 360bbb-3(b)(1), unless the authorization is terminated or revoked.  Performed at [facility], [address], [place], [place] [address]   Surgical PCR screen     Status: None   Collection Time: 05/22/21 12:53 AM   Specimen: Nasal Mucosa; Nasal Swab  Result Value Ref Range Status   MRSA, PCR NEGATIVE NEGATIVE Final   Staphylococcus aureus NEGATIVE NEGATIVE Final    Comment: (NOTE) The Xpert SA Assay (FDA approved for NASAL specimens in patients 37 years of age and older), is one component of a comprehensive surveillance program. It is not intended to diagnose infection nor to guide or monitor treatment. Performed at [facility], [address], [place], [place] [address]   Culture, blood (x 2)     Status: None (Preliminary result)   Collection Time: 05/23/21 11:33 PM   Specimen: BLOOD RIGHT HAND  Result Value Ref Range Status   Specimen Description BLOOD RIGHT HAND  Final   Special Requests   Final    BOTTLES DRAWN AEROBIC AND ANAEROBIC Blood Culture adequate volume   Culture   Final    NO GROWTH < 12 HOURS Performed at [facility], [address] [place], [place] [address]    Report Status PENDING  Incomplete  Culture, blood (x 2)     Status: None (Preliminary result)   Collection Time: 05/23/21 11:42 PM   Specimen: BLOOD LEFT HAND  Result Value Ref Range Status   Specimen Description BLOOD LEFT HAND  Final   Special Requests   Final    BOTTLES DRAWN AEROBIC AND ANAEROBIC Blood Culture adequate volume   Culture   Final    NO GROWTH < 12 HOURS Performed at [facility], [address], [place], [place] [address]    Report Status PENDING  Incomplete     Radiology Studies: DG Abd 1  View  Result Date: 05/24/2021 CLINICAL DATA:  NG tube placement EXAM: ABDOMEN - 1 VIEW COMPARISON:  None. FINDINGS: Esophagogastric tube is position with tip and side port below the diaphragm. Distended loops of small bowel in the partially imaged ventral abdomen, measuring up to 4.2 cm in caliber. IMPRESSION: 1. Esophagogastric tube is position with tip and side port below the diaphragm. 2. Distended loops of small bowel in the partially imaged ventral abdomen, measuring up to 4.2 cm in caliber. Electronically Signed   By: Eddie Candle M.D.   On: 05/24/2021 15:18      Scheduled Meds:  enoxaparin (  LOVENOX) injection  60 mg Subcutaneous Q24H   ondansetron (ZOFRAN) IV  4 mg Intravenous Once     LOS: 4 days    Time spent: 35 minutes    Barton Dubois, MD [facility]  If 7PM-7AM, please contact night-coverage [email or web address] 05/24/2021, 6:52 PM

## 2021-05-24 NOTE — Progress Notes (Signed)
Patient had small bowel movement.

## 2021-05-24 NOTE — Progress Notes (Signed)
Patient continues to have nausea and vomiting. Large amount and green color. New order for NG tube placement, IVF and KUB. Will continue to monitor.

## 2021-05-24 NOTE — Progress Notes (Signed)
Maleny Candy RWE:[id number] DOB: 17-May-1971 DOA: 05/20/2021  Chief Complaint: Nausea, vomiting and fever.  HPI: Penny Garza is a 50 y.o. female with medical history significant of normocytic anemia, class III obesity who was admitted 3 days ago and surgically intervened for ventral hernia repair today.  She also has elevated LFTs and a small bowel obstruction due to adhesions.  She had 1 episode of emesis earlier after having dinner.  She has increased pain in both lower quadrants.  She feels like there is hardening on her RLQ area.  I discussed this with Dr. Arnoldo Morale who thought that her pain was related to her procedure.  He asked for a Dulcolax suppository to be given and changed to clear liquids after period of bowel rest.  He also stated to consider abdominal wall muscle spasm from the incision.  I gave her a dose so metoclopramide 10 mg IVP and an extra dose of 25 mcg of fentanyl.  The patient subsequently became febrile at 2259 with a temperature of 101.2 F along with tachycardia 115 bpm.  The rest of the vital signs were stable.  No dyspnea or changes in surgical area.  Past Medical History:  Diagnosis Date   Anemia    Complication of anesthesia    Patient woke up during procedure   Past Surgical History:  Procedure Laterality Date   BILATERAL SALPINGECTOMY Bilateral 05/01/2021   Procedure: OPEN BILATERAL SALPINGECTOMY and OOPHORECTOMY;  Surgeon: Florian Buff, MD;  Location: [facility];  Service: Gynecology;  Laterality: Bilateral;   COLONOSCOPY     ENDOMETRIAL BIOPSY     IUD REMOVAL     LAPAROSCOPIC LYSIS OF ADHESIONS N/A 05/22/2021   Procedure: LYSIS OF ADHESIONS;  Surgeon: Aviva Signs, MD;  Location: [facility];  Service: General;  Laterality: N/A;   SUPRACERVICAL ABDOMINAL HYSTERECTOMY N/A 05/01/2021   Procedure: HYSTERECTOMY SUPRACERVICAL ABDOMINAL;  Surgeon: Florian Buff, MD;  Location: [facility];  Service: Gynecology;  Laterality: N/A;   VENTRAL HERNIA REPAIR N/A 05/22/2021   Procedure: VENTRAL  HERNIORRHAPHY WITH MESH;  Surgeon: Aviva Signs, MD;  Location: [facility];  Service: General;  Laterality: N/A;   Social History  reports that she has never smoked. She has never used smokeless tobacco. She reports previous alcohol use. She reports that she does not use drugs.  Allergies  Allergen Reactions   Codeine Anaphylaxis   Iron Sucrose Rash   Azithromycin    Milk-Related Compounds Other (See Comments)    Stomach pain   Other Dermatitis    Certain metals     Family History  Problem Relation Age of Onset   Heart attack Mother    Diabetes Father    Hypertension Father    Alcohol abuse Father    Diabetes Maternal Grandfather    Diabetes Paternal Grandmother    Diabetes Paternal Grandfather    Colon cancer Neg Hx    Prior to Admission medications   Medication Sig Start Date End Date Taking? Authorizing Provider  dicyclomine (BENTYL) 20 MG tablet Take 1 tablet (20 mg total) by mouth 3 (three) times daily as needed. 05/16/21  Yes Long, Wonda Olds, MD  ferrous sulfate 325 (65 FE) MG tablet Take 325 mg by mouth daily with breakfast.   Yes [provider]  ibuprofen (ADVIL) 600 MG tablet Take 1 tablet (600 mg total) by mouth every 6 (six) hours. 05/03/21  Yes Florian Buff, MD  ondansetron (ZOFRAN) 8 MG tablet Take 1 tablet (8 mg total) by mouth every  6 (six) hours as needed for nausea. 05/03/21  Yes Florian Buff, MD  oxyCODONE (OXY IR/ROXICODONE) 5 MG immediate release tablet Take 1-2 tablets (5-10 mg total) by mouth every 4 (four) hours as needed for moderate pain. Patient not taking: No sig reported 05/03/21   Florian Buff, MD   Physical Exam: Vitals:   05/23/21 1333 05/23/21 2259 05/24/21 0020 05/24/21 0106  BP: 97/66 115/74  119/63  Pulse: 99 (!) 115  (!) 109  Resp: 18 20  20   Temp: 99.1 F (37.3 C) (!) 101.2 F (38.4 C) (!) 100.7 F (38.2 C) 100.3 F (37.9 C)  TempSrc: Oral Oral Oral Oral  SpO2: 100% 98%  100%   Constitutional: NAD, calm, comfortable Eyes:  PERRL, lids and conjunctivae normal ENMT: Mucous membranes are moist. Posterior pharynx clear of any exudate or lesions. Neck: normal, supple, no masses, no thyromegaly Respiratory: clear to auscultation bilaterally, no wheezing, no crackles. Normal respiratory effort. No accessory muscle use.  Cardiovascular: Regular rate and rhythm, no murmurs / rubs / gallops. No extremity edema. 2+ pedal pulses. No carotid bruits.  Abdomen: Obese, no distention.  Dressings and mesh look clear.  Faint bowel sounds.  Soft, positive epigastric and bilateral lower quadrant tenderness, no guarding or rebound, masses palpated. No hepatosplenomegaly. Bowel sounds positive.  Musculoskeletal: no clubbing / cyanosis. Good ROM, no contractures. Normal muscle tone.  Neurologic: CN 2-12 grossly intact. Sensation intact, DTR normal. Strength 5/5 in all 4.  Psychiatric: Normal judgment and insight. Alert and oriented x 3. Normal mood.   Labs on Admission: I have personally reviewed following labs and imaging studies  CBC: Recent Labs  Lab 05/20/21 1652 05/21/21 0645 05/22/21 0427 05/23/21 0619 05/23/21 2333  WBC 5.5 4.5 4.5 8.0 9.1  NEUTROABS 3.8 2.7  --   --  7.0  HGB 10.4* 9.4* 9.6* 10.9* 10.9*  HCT 34.8* 31.9* 32.8* 37.3 36.1  MCV 93.8 94.4 96.5 94.9 91.9  PLT 405* 364 375 358 222    Basic Metabolic Panel: Recent Labs  Lab 05/20/21 1652 05/21/21 0645 05/22/21 0427 05/23/21 0619 05/23/21 2333  NA 141 140 141 137 135  K 3.4* 3.2* 3.4* 3.8 3.2*  CL 105 107 107 102 102  CO2 25 25 23 25 23   GLUCOSE 95 110* 93 120* 121*  BUN 9 8 8 8 17   CREATININE 0.75 0.63 0.57 0.89 0.98  CALCIUM 9.5 8.9 9.4 9.2 8.8*  MG  --   --  2.0 1.8 1.8  PHOS  --   --   --   --  3.1    GFR: Estimated Creatinine Clearance: 93.6 mL/min (by C-G formula based on SCr of 0.98 mg/dL).  Liver Function Tests: Recent Labs  Lab 05/20/21 1652 05/21/21 0645 05/22/21 0427 05/23/21 0619 05/23/21 2333  AST 84* 70* 79* 66* 34  ALT  80* 73* 80* 67* 53*  ALKPHOS 128* 120 130* 130* 130*  BILITOT 1.8* 2.0* 1.8* 2.1* 2.9*  PROT 8.4* 7.2 7.6 7.5 8.1  ALBUMIN 4.0 3.6 3.7 3.7 3.7   Urine analysis:    Component Value Date/Time   COLORURINE YELLOW 05/20/2021 1727   APPEARANCEUR CLOUDY (A) 05/20/2021 1727   LABSPEC 1.021 05/20/2021 1727   PHURINE 7.0 05/20/2021 1727   GLUCOSEU NEGATIVE 05/20/2021 1727   HGBUR SMALL (A) 05/20/2021 1727   BILIRUBINUR NEGATIVE 05/20/2021 1727   KETONESUR 80 (A) 05/20/2021 1727   PROTEINUR 30 (A) 05/20/2021 1727   NITRITE NEGATIVE 05/20/2021 1727   LEUKOCYTESUR  NEGATIVE 05/20/2021 1727   Assessment/Plan Principal Problem:   Nausea & vomiting   Fever  (Likely postop cytokine reaction and not infectious)   S/P TAH-BSO (total abdominal hysterectomy and bilateral salpingo-oophorectomy)   Abdominal pain   Elevated LFTs   Ventral hernia with obstruction and without gangrene  Started IV hydration with 3000 mL of fluid with potassium supplementation.  Although this is likely postop fever, I started empiric antibiotic therapy after drawing blood cultures.  She had a second episode of emesis around 0330, but is not distended.  Continue analgesics and antiemetics.  Will add maintenance fluids after IV boluses.  Tennis Must, MD.  About 50 minutes of critical care time have been spent during the process of these urgent events.  This document was prepared using Dragon voice recognition software and may contain some unintended transcription errors.

## 2021-05-24 NOTE — Progress Notes (Signed)
Patient vomiting again. Large amount. Green color. Will continue to monitor. MD Olevia Bowens made aware.

## 2021-05-25 ENCOUNTER — Inpatient Hospital Stay (HOSPITAL_COMMUNITY): Payer: 59

## 2021-05-25 LAB — CBC
HCT: 30.5 % — ABNORMAL LOW (ref 36.0–46.0)
Hemoglobin: 8.8 g/dL — ABNORMAL LOW (ref 12.0–15.0)
MCH: 27.4 pg (ref 26.0–34.0)
MCHC: 28.9 g/dL — ABNORMAL LOW (ref 30.0–36.0)
MCV: 95 fL (ref 80.0–100.0)
Platelets: 325 10*3/uL (ref 150–400)
RBC: 3.21 MIL/uL — ABNORMAL LOW (ref 3.87–5.11)
RDW: 15.9 % — ABNORMAL HIGH (ref 11.5–15.5)
WBC: 5.6 10*3/uL (ref 4.0–10.5)
nRBC: 0 % (ref 0.0–0.2)

## 2021-05-25 LAB — BASIC METABOLIC PANEL
Anion gap: 7 (ref 5–15)
BUN: 13 mg/dL (ref 6–20)
CO2: 25 mmol/L (ref 22–32)
Calcium: 8.8 mg/dL — ABNORMAL LOW (ref 8.9–10.3)
Chloride: 110 mmol/L (ref 98–111)
Creatinine, Ser: 0.62 mg/dL (ref 0.44–1.00)
GFR, Estimated: >60 mL/min (ref >60)
Glucose, Bld: 98 mg/dL (ref 70–99)
Potassium: 3.5 mmol/L (ref 3.5–5.1)
Sodium: 142 mmol/L (ref 135–145)

## 2021-05-25 LAB — MAGNESIUM: Magnesium: 2.1 mg/dL (ref 1.7–2.4)

## 2021-05-25 NOTE — Progress Notes (Signed)
PROGRESS NOTE    Penny Garza  XTA:[id number] DOB: 1970-12-02 DOA: 05/20/2021 PCP: [facility]   Brief Narrative:   Penny Garza is a 51 y.o. female with no significant past medical history presents to the ER for the second time in 5 days with complaints of nausea vomiting abdominal discomfort.  Patient was admitted with intractable nausea and vomiting with possible partial small bowel obstruction.  She is also noted to have some mild transaminitis with right upper quadrant ultrasound demonstrating some cholelithiasis and gallbladder sludge.  Patient has undergone ventral herniorrhaphy with mesh on 7/6 and continues to have abdominal pain, nausea and vomiting.  Assessment & Plan:   Principal Problem:   Nausea & vomiting Active Problems:   S/P TAH-BSO (total abdominal hysterectomy and bilateral salpingo-oophorectomy)   Abdominal pain   Elevated LFTs   Ventral hernia with obstruction and without gangrene   Small bowel obstruction due to adhesions (HCC)   Intractable nausea and vomiting with possible SBO and now postoperative ileus -Status post ventral herniorrhaphy on 7/6 -Appreciate ongoing assistance and postoperative care by general surgery -Patient needs to ambulate more and have better pain control prior to discharge per general surgery.   -Repeat abdominal x-ray overnight demonstrating still dilated small intestinal loops -NG tube in place with increased output. -Continue IV fluids, antiemetics and analgesics. -Follow electrolytes trend.     Mild transaminitis-stabilized -Can be followed further outpatient -Noted to have gallbladder sludge with cholelithiasis -Appreciate GI recommendations for follow-up in the outpatient setting in 4 weeks.   Recent total abdominal hysterectomy -Appreciate gynecology recommendations   Anemia -Hemoglobin remaining stable -No overt bleeding identified -Will continue monitoring CBC intermittently.   Morbid obesity -Low calorie diet,  portion control and increase physical activity.     DVT prophylaxis: SCDs Code Status: Full Family Communication: None at bedside Disposition Plan:  Status is: Inpatient   Remains inpatient appropriate because:IV treatments appropriate due to intensity of illness or inability to take PO and Inpatient level of care appropriate due to severity of illness   Dispo: The patient is from: Home              Anticipated d/c is to: Home              Patient currently is not medically stable to d/c.              Difficult to place patient No     Consultants:  General surgery GI Gynecology   Procedures:  See below   Antimicrobials:  None   Subjective: Afebrile, no chest pain, reports no nausea or vomiting today.  NG tube still with increase output in place.  Reports improvement in abdominal discomfort.  Objective: Vitals:   05/24/21 0501 05/24/21 1359 05/24/21 2034 05/25/21 0426  BP: 140/64 121/78 136/74 116/70  Pulse: (!) 102 96 (!) 101 95  Resp: 18 20 19 19   Temp: 98.9 F (37.2 C) 99.5 F (37.5 C) 98.9 F (37.2 C) 98.6 F (37 C)  TempSrc: Oral Oral Oral Oral  SpO2: 99% 99% 97% 99%    Intake/Output Summary (Last 24 hours) at 05/25/2021 1300 Last data filed at 05/25/2021 1252 Gross per 24 hour  Intake 200 ml  Output 400 ml  Net -200 ml   There were no vitals filed for this visit.  Examination: General exam: Alert, awake, oriented x 3; NG tube in place; reports improvement in symptoms.  Currently nausea vomiting.  Requested ice chips.  Still significant output appreciated  from NG. Respiratory system: Clear to auscultation. Respiratory effort normal. Cardiovascular system:RRR. No rubs or gallops. Gastrointestinal system: Abdomen appropriately tender to palpation around incision.  No bowel sounds appreciated on examination.  Binder in place. Central nervous system: Alert and oriented. No focal neurological deficits. Extremities: No cyanosis or clubbing. Skin: No  petechiae. Psychiatry: Judgement and insight appear normal. Mood & affect appropriate.    Data Reviewed: I have personally reviewed following labs and imaging studies  CBC: Recent Labs  Lab 05/20/21 1652 05/21/21 0645 05/22/21 0427 05/23/21 0619 05/23/21 2333 05/24/21 0608 05/25/21 0426  WBC 5.5 4.5 4.5 8.0 9.1 8.0 5.6  NEUTROABS 3.8 2.7  --   --  7.0 6.0  --   HGB 10.4* 9.4* 9.6* 10.9* 10.9* 10.5* 8.8*  HCT 34.8* 31.9* 32.8* 37.3 36.1 35.2* 30.5*  MCV 93.8 94.4 96.5 94.9 91.9 93.4 95.0  PLT 405* 364 375 358 398 383 737   Basic Metabolic Panel: Recent Labs  Lab 05/22/21 0427 05/23/21 0619 05/23/21 2333 05/24/21 0608 05/25/21 0426  NA 141 137 135 139 142  K 3.4* 3.8 3.2* 3.7 3.5  CL 107 102 102 104 110  CO2 23 25 23 26 25   GLUCOSE 93 120* 121* 121* 98  BUN 8 8 17 17 13   CREATININE 0.57 0.89 0.98 0.85 0.62  CALCIUM 9.4 9.2 8.8* 8.9 8.8*  MG 2.0 1.8 1.8  --  2.1  PHOS  --   --  3.1  --   --    GFR: Estimated Creatinine Clearance: 114.6 mL/min (by C-G formula based on SCr of 0.62 mg/dL).  Liver Function Tests: Recent Labs  Lab 05/21/21 0645 05/22/21 0427 05/23/21 0619 05/23/21 2333 05/24/21 0608  AST 70* 79* 66* 34 26  ALT 73* 80* 67* 53* 46*  ALKPHOS 120 130* 130* 130* 120  BILITOT 2.0* 1.8* 2.1* 2.9* 2.4*  PROT 7.2 7.6 7.5 8.1 7.5  ALBUMIN 3.6 3.7 3.7 3.7 3.5   Recent Labs  Lab 05/20/21 1652  LIPASE 20   Coagulation Profile: Recent Labs  Lab 05/23/21 2333  INR 1.2    Sepsis Labs: Recent Labs  Lab 05/23/21 2333  PROCALCITON <0.10  LATICACIDVEN 1.3    Recent Results (from the past 240 hour(s))  Resp Panel by RT-PCR (Flu A&B, Covid) Nasopharyngeal Swab     Status: None   Collection Time: 05/20/21 10:34 PM   Specimen: Nasopharyngeal Swab; Nasopharyngeal(NP) swabs in vial transport medium  Result Value Ref Range Status   SARS Coronavirus 2 by RT PCR NEGATIVE NEGATIVE Final    Comment: (NOTE) SARS-CoV-2 target nucleic acids are NOT  DETECTED.  The SARS-CoV-2 RNA is generally detectable in upper respiratory specimens during the acute phase of infection. The lowest concentration of SARS-CoV-2 viral copies this assay can detect is 138 copies/mL. A negative result does not preclude SARS-Cov-2 infection and should not be used as the sole basis for treatment or other patient management decisions. A negative result may occur with  improper specimen collection/handling, submission of specimen other than nasopharyngeal swab, presence of viral mutation(s) within the areas targeted by this assay, and inadequate number of viral copies(<138 copies/mL). A negative result must be combined with clinical observations, patient history, and epidemiological information. The expected result is Negative.  Fact Sheet for Patients:  EntrepreneurPulse.com.au  Fact Sheet for Healthcare Providers:  IncredibleEmployment.be  This test is no t yet approved or cleared by the [place] FDA and  has been authorized for detection and/or diagnosis of SARS-CoV-2  by FDA under an Emergency Use Authorization (EUA). This EUA will remain  in effect (meaning this test can be used) for the duration of the COVID-19 declaration under Section 564(b)(1) of the Act, 21 U.S.C.section 360bbb-3(b)(1), unless the authorization is terminated  or revoked sooner.       Influenza A by PCR NEGATIVE NEGATIVE Final   Influenza B by PCR NEGATIVE NEGATIVE Final    Comment: (NOTE) The Xpert Xpress SARS-CoV-2/FLU/RSV plus assay is intended as an aid in the diagnosis of influenza from Nasopharyngeal swab specimens and should not be used as a sole basis for treatment. Nasal washings and aspirates are unacceptable for Xpert Xpress SARS-CoV-2/FLU/RSV testing.  Fact Sheet for Patients: EntrepreneurPulse.com.au  Fact Sheet for Healthcare Providers: IncredibleEmployment.be  This test is not yet  approved or cleared by the [place] FDA and has been authorized for detection and/or diagnosis of SARS-CoV-2 by FDA under an Emergency Use Authorization (EUA). This EUA will remain in effect (meaning this test can be used) for the duration of the COVID-19 declaration under Section 564(b)(1) of the Act, 21 U.S.C. section 360bbb-3(b)(1), unless the authorization is terminated or revoked.  Performed at [facility], [address], [place], [place] [address]   Surgical PCR screen     Status: None   Collection Time: 05/22/21 12:53 AM   Specimen: Nasal Mucosa; Nasal Swab  Result Value Ref Range Status   MRSA, PCR NEGATIVE NEGATIVE Final   Staphylococcus aureus NEGATIVE NEGATIVE Final    Comment: (NOTE) The Xpert SA Assay (FDA approved for NASAL specimens in patients 55 years of age and older), is one component of a comprehensive surveillance program. It is not intended to diagnose infection nor to guide or monitor treatment. Performed at [facility], [address] [place], [place] [address]   Culture, blood (x 2)     Status: None (Preliminary result)   Collection Time: 05/23/21 11:33 PM   Specimen: BLOOD RIGHT HAND  Result Value Ref Range Status   Specimen Description BLOOD RIGHT HAND  Final   Special Requests   Final    BOTTLES DRAWN AEROBIC AND ANAEROBIC Blood Culture adequate volume   Culture   Final    NO GROWTH 2 DAYS Performed at [facility], [address] [place], [place] [address]    Report Status PENDING  Incomplete  Culture, blood (x 2)     Status: None (Preliminary result)   Collection Time: 05/23/21 11:42 PM   Specimen: BLOOD LEFT HAND  Result Value Ref Range Status   Specimen Description BLOOD LEFT HAND  Final   Special Requests   Final    BOTTLES DRAWN AEROBIC AND ANAEROBIC Blood Culture adequate volume   Culture   Final    NO GROWTH 2 DAYS Performed at [facility], [address], [place], [place] [address]    Report Status PENDING  Incomplete     Radiology Studies: DG Abd 1 View  Result Date: 05/24/2021 CLINICAL DATA:  NG tube placement EXAM: ABDOMEN - 1 VIEW COMPARISON:  None. FINDINGS: Esophagogastric tube is position with tip and side port below the diaphragm. Distended loops of small bowel in the partially imaged ventral abdomen, measuring up to 4.2 cm in caliber. IMPRESSION: 1. Esophagogastric tube is position with tip and side port below the diaphragm. 2. Distended loops of small bowel in the partially imaged ventral abdomen, measuring up to 4.2 cm in caliber. Electronically Signed   By: Eddie Candle M.D.   On: 05/24/2021 15:18  DG CHEST PORT 1 VIEW  Result Date: 05/24/2021 CLINICAL DATA:  NG tube EXAM: PORTABLE CHEST 1 VIEW COMPARISON:  05/24/2021 FINDINGS: No focal opacity or pleural effusion. Cardiac size upper normal. No pneumothorax. Esophageal tube tip below the diaphragm but incompletely visualized. IMPRESSION: No active disease. Electronically Signed   By: Donavan Foil M.D.   On: 05/24/2021 20:06   DG Abd Portable 1V  Result Date: 05/25/2021 CLINICAL DATA:  Check gastric catheter placement EXAM: PORTABLE ABDOMEN - 1 VIEW COMPARISON:  Film from the previous day. FINDINGS: Gastric catheter is noted within the stomach. Persistent small bowel dilatation is noted. No free air is seen. Postsurgical changes are noted. IMPRESSION: Gastric catheter within the stomach. Persistent small bowel dilatation is again noted. Electronically Signed   By: Inez Catalina M.D.   On: 05/25/2021 03:19     Scheduled Meds:  enoxaparin (LOVENOX) injection  60 mg Subcutaneous Q24H   ondansetron (ZOFRAN) IV  4 mg Intravenous Once   pantoprazole (PROTONIX) IV  40 mg Intravenous Q12H     LOS: 5 days    Time spent: 35 minutes   Barton Dubois, MD [facility]  If 7PM-7AM, please contact night-coverage [email or web address] 05/25/2021, 1:00 PM

## 2021-05-25 NOTE — Progress Notes (Signed)
[facility] Progress Note  3 Days Post-Op  Subjective: Penny Garza is a 50 year old female status post-op day 3 following a ventral hernia repair with mesh and lysis of adhesions. She has had resolution of her n/v with NGT to [facility]. She appears significantly more comfortable today.  She reports gas with minimal flatus and no bowel activity.   Objective: Vital signs in last 24 hours: Temp:  [98.6 F (37 C)-99.5 F (37.5 C)] 98.6 F (37 C) (07/09 0426) Pulse Rate:  [95-101] 95 (07/09 0426) Resp:  [19-20] 19 (07/09 0426) BP: (116-136)/(70-78) 116/70 (07/09 0426) SpO2:  [97 %-99 %] 99 % (07/09 0426) Last BM Date: 05/21/21  Intake/Output from previous day: 07/08 0701 - 07/09 0700 In: 200 [IV Piggyback:200] Out: 400 [Emesis/NG output:400] Intake/Output this shift: No intake/output data recorded.  General appearance: alert, cooperative, and no distress Head: Normocephalic, without obvious abnormality, atraumatic Eyes: conjunctivae/corneas clear. PERRL, EOM's intact. Fundi benign. Resp: normal work of breathing Cardio: regular rate and rhythm GI: no diffuse abdominal tenderness aside from her incision.   Incision/Wound:some bleeding underneath honeycomb dressing, but incision site is covered well and intact  Lab Results:  Recent Labs    05/24/21 0608 05/25/21 0426  WBC 8.0 5.6  HGB 10.5* 8.8*  HCT 35.2* 30.5*  PLT 383 325   BMET Recent Labs    05/24/21 0608 05/25/21 0426  NA 139 142  K 3.7 3.5  CL 104 110  CO2 26 25  GLUCOSE 121* 98  BUN 17 13  CREATININE 0.85 0.62  CALCIUM 8.9 8.8*   PT/INR Recent Labs    05/23/21 2333  LABPROT 15.0  INR 1.2    Studies/Results: DG Abd 1 View  Result Date: 05/24/2021 CLINICAL DATA:  NG tube placement EXAM: ABDOMEN - 1 VIEW COMPARISON:  None. FINDINGS: Esophagogastric tube is position with tip and side port below the diaphragm. Distended loops of small bowel in the partially imaged ventral abdomen, measuring  up to 4.2 cm in caliber. IMPRESSION: 1. Esophagogastric tube is position with tip and side port below the diaphragm. 2. Distended loops of small bowel in the partially imaged ventral abdomen, measuring up to 4.2 cm in caliber. Electronically Signed   By: Eddie Candle M.D.   On: 05/24/2021 15:18   DG CHEST PORT 1 VIEW  Result Date: 05/24/2021 CLINICAL DATA:  NG tube EXAM: PORTABLE CHEST 1 VIEW COMPARISON:  05/24/2021 FINDINGS: No focal opacity or pleural effusion. Cardiac size upper normal. No pneumothorax. Esophageal tube tip below the diaphragm but incompletely visualized. IMPRESSION: No active disease. Electronically Signed   By: Donavan Foil M.D.   On: 05/24/2021 20:06   DG Abd Portable 1V  Result Date: 05/25/2021 CLINICAL DATA:  Check gastric catheter placement EXAM: PORTABLE ABDOMEN - 1 VIEW COMPARISON:  Film from the previous day. FINDINGS: Gastric catheter is noted within the stomach. Persistent small bowel dilatation is noted. No free air is seen. Postsurgical changes are noted. IMPRESSION: Gastric catheter within the stomach. Persistent small bowel dilatation is again noted. Electronically Signed   By: Inez Catalina M.D.   On: 05/25/2021 03:19    Anti-infectives: Anti-infectives (From admission, onward)    Start     Dose/Rate Route Frequency Ordered Stop   05/24/21 0000  cefTRIAXone (ROCEPHIN) 2 g in sodium chloride 0.9 % 100 mL IVPB        2 g 200 mL/hr over 30 Minutes Intravenous Every 24 hours 05/23/21 2317     05/24/21 0000  metroNIDAZOLE (  FLAGYL) IVPB 500 mg        500 mg 100 mL/hr over 60 Minutes Intravenous Every 8 hours 05/23/21 2317     05/23/21 0600  ceFAZolin (ANCEF) IVPB 3g/100 mL premix        3 g 200 mL/hr over 30 Minutes Intravenous On call to O.R. 05/22/21 1225 05/22/21 1501   05/22/21 1332  ceFAZolin (ANCEF) 3-0.9 GM/100ML-% IVPB       Note to Pharmacy: Abbie Sons   : cabinet override      05/22/21 1332 05/22/21 1434       Assessment/Plan: s/p  Procedure(s): VENTRAL HERNIORRHAPHY WITH MESH LYSIS OF ADHESIONS  Post-operative ileus.  Continue NGT to [facility],  Continue IVF Ck KUB and continue serial exams.    LOS: 5 days      Ronny Bacon 05/25/2021

## 2021-05-26 ENCOUNTER — Inpatient Hospital Stay (HOSPITAL_COMMUNITY): Payer: 59

## 2021-05-26 NOTE — Progress Notes (Signed)
PROGRESS NOTE    Penny Garza  AJO:[id number] DOB: January 25, 1971 DOA: 05/20/2021 PCP: [facility]   Brief Narrative:   Penny Garza is a 50 y.o. female with no significant past medical history presents to the ER for the second time in 5 days with complaints of nausea vomiting abdominal discomfort.  Patient was admitted with intractable nausea and vomiting with possible partial small bowel obstruction.  She is also noted to have some mild transaminitis with right upper quadrant ultrasound demonstrating some cholelithiasis and gallbladder sludge.  Patient has undergone ventral herniorrhaphy with mesh on 7/6 and continues to have abdominal pain, nausea and vomiting.  Assessment & Plan:   Principal Problem:   Nausea & vomiting Active Problems:   S/P TAH-BSO (total abdominal hysterectomy and bilateral salpingo-oophorectomy)   Abdominal pain   Elevated LFTs   Ventral hernia with obstruction and without gangrene   Small bowel obstruction due to adhesions (HCC)   Intractable nausea and vomiting with possible SBO and now postoperative ileus -Status post ventral herniorrhaphy on 7/6 -Appreciate ongoing assistance and postoperative care by general surgery -Patient reported improvement in abdominal discomfort and no further episode of nausea or vomiting. -She is passing gas; still no bowel movements. -KUB this morning continue demonstrating evidence of partial small bowel obstruction versus ileus; Perhaps minimally improved compared to 05/25/2021 exam. -Following general surgery recommendations will attempt NG tube clamping and potentially removal later today. -Continue IV fluids, antiemetics and analgesics. -Follow electrolytes trend and replete as needed..     Mild transaminitis -stabilized -Can be followed further outpatient -Noted to have gallbladder sludge with cholelithiasis -Appreciate GI recommendations for follow-up in the outpatient setting in 4 weeks.   Recent total abdominal  hysterectomy -Appreciate gynecology recommendations -Continue outpatient follow-up after discharge.     Anemia -Hemoglobin has remained stable -No overt bleeding identified -Will continue monitoring CBC intermittently. -Last hemoglobin 8.8; in the setting most likely of hemodilution.   Morbid obesity -Low calorie diet, portion control and increase physical activity.     DVT prophylaxis: SCDs Code Status: Full Family Communication: None at bedside Disposition Plan:  Status is: Inpatient   Remains inpatient appropriate because:IV treatments appropriate due to intensity of illness or inability to take PO and Inpatient level of care appropriate due to severity of illness   Dispo: The patient is from: Home              Anticipated d/c is to: Home              Patient currently is not medically stable to d/c.              Difficult to place patient No     Consultants:  General surgery GI Gynecology   Procedures:  See below   Antimicrobials:  None   Subjective: No fever, no chest pain, no nausea gas.  Still without BMs.  Objective: Vitals:   05/25/21 0426 05/25/21 1315 05/25/21 2100 05/26/21 0523  BP: 116/70 (!) 145/80  138/69  Pulse: 95 89 85 78  Resp: 19 18 16    Temp: 98.6 F (37 C) 97.7 F (36.5 C)  98.1 F (36.7 C)  TempSrc: Oral Oral  Oral  SpO2: 99% 100% 98% 100%    Intake/Output Summary (Last 24 hours) at 05/26/2021 1133 Last data filed at 05/26/2021 0900 Gross per 24 hour  Intake 0 ml  Output --  Net 0 ml   There were no vitals filed for this visit.  Examination: General exam:  Alert, awake, oriented x 3; NG tube in place but currently clamped.  Patient denies nausea, vomiting or significant abdominal discomfort.  She is afebrile. Respiratory system: Clear to auscultation. Respiratory effort normal. Cardiovascular system:RRR. No rubs or gallops. Gastrointestinal system: Abdomen is soft, no guarding appreciated.  Reports pain around incision only.   Decreased but present bowel sounds appreciated on exam.   Central nervous system: Alert and oriented. No focal neurological deficits. Extremities: No Cyanosis or clubbing. Skin: No petechiae. Psychiatry: Judgement and insight appear normal. Mood & affect appropriate.   Data Reviewed: I have personally reviewed following labs and imaging studies  CBC: Recent Labs  Lab 05/20/21 1652 05/21/21 0645 05/22/21 0427 05/23/21 0619 05/23/21 2333 05/24/21 0608 05/25/21 0426  WBC 5.5 4.5 4.5 8.0 9.1 8.0 5.6  NEUTROABS 3.8 2.7  --   --  7.0 6.0  --   HGB 10.4* 9.4* 9.6* 10.9* 10.9* 10.5* 8.8*  HCT 34.8* 31.9* 32.8* 37.3 36.1 35.2* 30.5*  MCV 93.8 94.4 96.5 94.9 91.9 93.4 95.0  PLT 405* 364 375 358 398 383 263   Basic Metabolic Panel: Recent Labs  Lab 05/22/21 0427 05/23/21 0619 05/23/21 2333 05/24/21 0608 05/25/21 0426  NA 141 137 135 139 142  K 3.4* 3.8 3.2* 3.7 3.5  CL 107 102 102 104 110  CO2 23 25 23 26 25   GLUCOSE 93 120* 121* 121* 98  BUN 8 8 17 17 13   CREATININE 0.57 0.89 0.98 0.85 0.62  CALCIUM 9.4 9.2 8.8* 8.9 8.8*  MG 2.0 1.8 1.8  --  2.1  PHOS  --   --  3.1  --   --    GFR: Estimated Creatinine Clearance: 114.6 mL/min (by C-G formula based on SCr of 0.62 mg/dL).  Liver Function Tests: Recent Labs  Lab 05/21/21 0645 05/22/21 0427 05/23/21 0619 05/23/21 2333 05/24/21 0608  AST 70* 79* 66* 34 26  ALT 73* 80* 67* 53* 46*  ALKPHOS 120 130* 130* 130* 120  BILITOT 2.0* 1.8* 2.1* 2.9* 2.4*  PROT 7.2 7.6 7.5 8.1 7.5  ALBUMIN 3.6 3.7 3.7 3.7 3.5   Recent Labs  Lab 05/20/21 1652  LIPASE 20   Coagulation Profile: Recent Labs  Lab 05/23/21 2333  INR 1.2    Sepsis Labs: Recent Labs  Lab 05/23/21 2333  PROCALCITON <0.10  LATICACIDVEN 1.3    Recent Results (from the past 240 hour(s))  Resp Panel by RT-PCR (Flu A&B, Covid) Nasopharyngeal Swab     Status: None   Collection Time: 05/20/21 10:34 PM   Specimen: Nasopharyngeal Swab; Nasopharyngeal(NP)  swabs in vial transport medium  Result Value Ref Range Status   SARS Coronavirus 2 by RT PCR NEGATIVE NEGATIVE Final    Comment: (NOTE) SARS-CoV-2 target nucleic acids are NOT DETECTED.  The SARS-CoV-2 RNA is generally detectable in upper respiratory specimens during the acute phase of infection. The lowest concentration of SARS-CoV-2 viral copies this assay can detect is 138 copies/mL. A negative result does not preclude SARS-Cov-2 infection and should not be used as the sole basis for treatment or other patient management decisions. A negative result may occur with  improper specimen collection/handling, submission of specimen other than nasopharyngeal swab, presence of viral mutation(s) within the areas targeted by this assay, and inadequate number of viral copies(<138 copies/mL). A negative result must be combined with clinical observations, patient history, and epidemiological information. The expected result is Negative.  Fact Sheet for Patients:  EntrepreneurPulse.com.au  Fact Sheet for Healthcare Providers:  IncredibleEmployment.be  This test is no t yet approved or cleared by the [place] and  has been authorized for detection and/or diagnosis of SARS-CoV-2 by FDA under an Emergency Use Authorization (EUA). This EUA will remain  in effect (meaning this test can be used) for the duration of the COVID-19 declaration under Section 564(b)(1) of the Act, 21 U.S.C.section 360bbb-3(b)(1), unless the authorization is terminated  or revoked sooner.       Influenza A by PCR NEGATIVE NEGATIVE Final   Influenza B by PCR NEGATIVE NEGATIVE Final    Comment: (NOTE) The Xpert Xpress SARS-CoV-2/FLU/RSV plus assay is intended as an aid in the diagnosis of influenza from Nasopharyngeal swab specimens and should not be used as a sole basis for treatment. Nasal washings and aspirates are unacceptable for Xpert Xpress  SARS-CoV-2/FLU/RSV testing.  Fact Sheet for Patients: EntrepreneurPulse.com.au  Fact Sheet for Healthcare Providers: IncredibleEmployment.be  This test is not yet approved or cleared by the [place] FDA and has been authorized for detection and/or diagnosis of SARS-CoV-2 by FDA under an Emergency Use Authorization (EUA). This EUA will remain in effect (meaning this test can be used) for the duration of the COVID-19 declaration under Section 564(b)(1) of the Act, 21 U.S.C. section 360bbb-3(b)(1), unless the authorization is terminated or revoked.  Performed at [facility], [address] [place], [place] [address]   Surgical PCR screen     Status: None   Collection Time: 05/22/21 12:53 AM   Specimen: Nasal Mucosa; Nasal Swab  Result Value Ref Range Status   MRSA, PCR NEGATIVE NEGATIVE Final   Staphylococcus aureus NEGATIVE NEGATIVE Final    Comment: (NOTE) The Xpert SA Assay (FDA approved for NASAL specimens in patients 93 years of age and older), is one component of a comprehensive surveillance program. It is not intended to diagnose infection nor to guide or monitor treatment. Performed at [facility], [address], [place], [place] [address]   Culture, blood (x 2)     Status: None (Preliminary result)   Collection Time: 05/23/21 11:33 PM   Specimen: BLOOD RIGHT HAND  Result Value Ref Range Status   Specimen Description BLOOD RIGHT HAND  Final   Special Requests   Final    BOTTLES DRAWN AEROBIC AND ANAEROBIC Blood Culture adequate volume   Culture   Final    NO GROWTH 2 DAYS Performed at [facility], [address], [place], [place] [address]    Report Status PENDING  Incomplete  Culture, blood (x 2)     Status: None (Preliminary result)   Collection Time: 05/23/21 11:42 PM   Specimen: BLOOD LEFT HAND  Result Value Ref Range Status   Specimen Description BLOOD LEFT HAND  Final   Special Requests   Final    BOTTLES DRAWN  AEROBIC AND ANAEROBIC Blood Culture adequate volume   Culture   Final    NO GROWTH 2 DAYS Performed at [facility], [address], [place], [place] [address]    Report Status PENDING  Incomplete    Radiology Studies: DG Abd 1 View  Result Date: 05/24/2021 CLINICAL DATA:  NG tube placement EXAM: ABDOMEN - 1 VIEW COMPARISON:  None. FINDINGS: Esophagogastric tube is position with tip and side port below the diaphragm. Distended loops of small bowel in the partially imaged ventral abdomen, measuring up to 4.2 cm in caliber. IMPRESSION: 1. Esophagogastric tube is position with tip and side port below the diaphragm. 2. Distended loops of small bowel in  the partially imaged ventral abdomen, measuring up to 4.2 cm in caliber. Electronically Signed   By: Eddie Candle M.D.   On: 05/24/2021 15:18   DG CHEST PORT 1 VIEW  Result Date: 05/24/2021 CLINICAL DATA:  NG tube EXAM: PORTABLE CHEST 1 VIEW COMPARISON:  05/24/2021 FINDINGS: No focal opacity or pleural effusion. Cardiac size upper normal. No pneumothorax. Esophageal tube tip below the diaphragm but incompletely visualized. IMPRESSION: No active disease. Electronically Signed   By: Donavan Foil M.D.   On: 05/24/2021 20:06   DG Abd Portable 1V  Result Date: 05/26/2021 CLINICAL DATA:  Ileus after hernia repair surgery on July 6th. EXAM: PORTABLE ABDOMEN - 1 VIEW COMPARISON:  Plain film dated 05/25/2021. FINDINGS: Enteric tube in the stomach. Continued dilatation of the gas-filled small bowel loops in the LEFT abdomen, although perhaps minimally improved compared to yesterday's exam. Surgical staples are seen over the midline abdomen. Lung bases are clear. IMPRESSION: Continued evidence of partial small bowel obstruction versus ileus, although perhaps minimally improved compared to yesterday's exam. Enteric tube in the stomach. Electronically Signed   By: Franki Cabot M.D.   On: 05/26/2021 09:55   DG Abd Portable 1V  Result Date: 05/25/2021 CLINICAL DATA:   Check gastric catheter placement EXAM: PORTABLE ABDOMEN - 1 VIEW COMPARISON:  Film from the previous day. FINDINGS: Gastric catheter is noted within the stomach. Persistent small bowel dilatation is noted. No free air is seen. Postsurgical changes are noted. IMPRESSION: Gastric catheter within the stomach. Persistent small bowel dilatation is again noted. Electronically Signed   By: Inez Catalina M.D.   On: 05/25/2021 03:19     Scheduled Meds:  enoxaparin (LOVENOX) injection  60 mg Subcutaneous Q24H   ondansetron (ZOFRAN) IV  4 mg Intravenous Once   pantoprazole (PROTONIX) IV  40 mg Intravenous Q12H     LOS: 6 days    Time spent: 35 minutes   Barton Dubois, MD [facility]  If 7PM-7AM, please contact night-coverage [email or web address] 05/26/2021, 11:33 AM

## 2021-05-26 NOTE — Progress Notes (Signed)
[facility] Progress Note  4 Days Post-Op  Subjective: Penny Garza is a 49 year old female status post-op day 4 following a ventral hernia repair with mesh and lysis of adhesions. She has had resolution of her n/v with NGT to [facility] She appears yet more comfortable today.  She reports flatus, denies nausea and pain(aside from incisional) and no bowel activity.   Objective: Vital signs in last 24 hours: Temp:  [97.7 F (36.5 C)-98.1 F (36.7 C)] 98.1 F (36.7 C) (07/10 0523) Pulse Rate:  [78-89] 78 (07/10 0523) Resp:  [16-18] 16 (07/09 2100) BP: (138-145)/(69-80) 138/69 (07/10 0523) SpO2:  [98 %-100 %] 100 % (07/10 0523) Last BM Date: 05/21/21  Intake/Output from previous day: No intake/output data recorded. Intake/Output this shift: No intake/output data recorded.  NGO appears minimal and clear.   General appearance: alert, cooperative, and no distress Head: Normocephalic, without obvious abnormality, atraumatic Eyes: conjunctivae/corneas clear. PERRL, EOM's intact. Fundi benign. Resp: normal work of breathing Cardio: regular rate and rhythm GI: no abdominal tenderness aside from her incision.   Incision/Wound: honeycomb dressing removed, incision site is clean, dry and intact  Lab Results:  Recent Labs    05/24/21 0608 05/25/21 0426  WBC 8.0 5.6  HGB 10.5* 8.8*  HCT 35.2* 30.5*  PLT 383 325   BMET Recent Labs    05/24/21 0608 05/25/21 0426  NA 139 142  K 3.7 3.5  CL 104 110  CO2 26 25  GLUCOSE 121* 98  BUN 17 13  CREATININE 0.85 0.62  CALCIUM 8.9 8.8*   PT/INR Recent Labs    05/23/21 2333  LABPROT 15.0  INR 1.2    Studies/Results: DG Abd 1 View  Result Date: 05/24/2021 CLINICAL DATA:  NG tube placement EXAM: ABDOMEN - 1 VIEW COMPARISON:  None. FINDINGS: Esophagogastric tube is position with tip and side port below the diaphragm. Distended loops of small bowel in the partially imaged ventral abdomen, measuring up to 4.2 cm in caliber.  IMPRESSION: 1. Esophagogastric tube is position with tip and side port below the diaphragm. 2. Distended loops of small bowel in the partially imaged ventral abdomen, measuring up to 4.2 cm in caliber. Electronically Signed   By: Eddie Candle M.D.   On: 05/24/2021 15:18   DG CHEST PORT 1 VIEW  Result Date: 05/24/2021 CLINICAL DATA:  NG tube EXAM: PORTABLE CHEST 1 VIEW COMPARISON:  05/24/2021 FINDINGS: No focal opacity or pleural effusion. Cardiac size upper normal. No pneumothorax. Esophageal tube tip below the diaphragm but incompletely visualized. IMPRESSION: No active disease. Electronically Signed   By: Donavan Foil M.D.   On: 05/24/2021 20:06   DG Abd Portable 1V  Result Date: 05/25/2021 CLINICAL DATA:  Check gastric catheter placement EXAM: PORTABLE ABDOMEN - 1 VIEW COMPARISON:  Film from the previous day. FINDINGS: Gastric catheter is noted within the stomach. Persistent small bowel dilatation is noted. No free air is seen. Postsurgical changes are noted. IMPRESSION: Gastric catheter within the stomach. Persistent small bowel dilatation is again noted. Electronically Signed   By: Inez Catalina M.D.   On: 05/25/2021 03:19    Anti-infectives: Anti-infectives (From admission, onward)    Start     Dose/Rate Route Frequency Ordered Stop   05/24/21 0000  cefTRIAXone (ROCEPHIN) 2 g in sodium chloride 0.9 % 100 mL IVPB        2 g 200 mL/hr over 30 Minutes Intravenous Every 24 hours 05/23/21 2317     05/24/21 0000  metroNIDAZOLE (FLAGYL) IVPB  500 mg        500 mg 100 mL/hr over 60 Minutes Intravenous Every 8 hours 05/23/21 2317     05/23/21 0600  ceFAZolin (ANCEF) IVPB 3g/100 mL premix        3 g 200 mL/hr over 30 Minutes Intravenous On call to O.R. 05/22/21 1225 05/22/21 1501   05/22/21 1332  ceFAZolin (ANCEF) 3-0.9 GM/100ML-% IVPB       Note to Pharmacy: Abbie Sons   : cabinet override      05/22/21 1332 05/22/21 1434       Assessment/Plan: s/p Procedure(s): VENTRAL HERNIORRHAPHY  WITH MESH LYSIS OF ADHESIONS  Post-operative ileus.  Clamping trial/removal of NGT.  Likely resume CLD later today.  Continue IVF Ambulate.    LOS: 6 days      Ronny Bacon 05/26/2021

## 2021-05-27 MED ORDER — SIMETHICONE 40 MG/0.6ML PO SUSP: 40.0000 mg | Freq: Four times a day (QID) | ORAL | Status: DC | PRN

## 2021-05-27 MED ORDER — SODIUM CHLORIDE 0.9 % IV SOLN: INTRAVENOUS | Status: DC | PRN

## 2021-05-27 NOTE — Progress Notes (Signed)
[facility] Progress Note  5 Days Post-Op  Subjective: Ms. Penny Garza is a 50 year old female status post-op day 5 following a ventral hernia repair with mesh and lysis of adhesions. She began experiencing nausea and vomiting early this past weekend but it has since subsided. She is doing well this morning but is experiencing some anxiety about her symptoms. She had an NG Tube placed Friday, and she says the pain with insertion has made her anxious since then.   She still feels her insides "sloshing around when she gets out of bed" and that, along with the increasing sensation she feels on her external abdomen at the wound site, is bothering her. She is no longer experiencing the cramping pain in her left side and has had no stomach aches. Yesterday, she had a bowel movement on her own for the first time. It was also her first time walking around on the floor, but she was very nervous that she would have an accident. Additionally, it was her first time consuming foods (apple juice, soup, jello, an ice cream pop), but she still prefers the thin liquids diet.  Objective: Vital signs in last 24 hours: Temp:  [98.6 F (37 C)-99.2 F (37.3 C)] 98.6 F (37 C) (07/11 0500) Pulse Rate:  [77-84] 78 (07/11 0500) Resp:  [18-19] 19 (07/11 0500) BP: (149-161)/(71-92) 151/74 (07/11 0500) SpO2:  [98 %-100 %] 100 % (07/11 0500) Last BM Date: 05/21/21  Intake/Output from previous day: 07/10 0701 - 07/11 0700 In: 900 [P.O.:900] Out: -  Intake/Output this shift: No intake/output data recorded.  General appearance: alert, cooperative, and mild distress Head: Normocephalic, without obvious abnormality, atraumatic Resp: normal breath sounds, no increased work of breathing Cardio: regular rate and rhythm GI: abdomen is tender Skin: increased sensation around the staples Incision/Wound: wound was clean and intact at last wound check  Lab Results:  Recent Labs    05/25/21 0426  WBC  5.6  HGB 8.8*  HCT 30.5*  PLT 325   BMET Recent Labs    05/25/21 0426  NA 142  K 3.5  CL 110  CO2 25  GLUCOSE 98  BUN 13  CREATININE 0.62  CALCIUM 8.8*   PT/INR No results for input(s): LABPROT, INR in the last 72 hours.  Studies/Results: DG Abd Portable 1V  Result Date: 05/26/2021 CLINICAL DATA:  Ileus after hernia repair surgery on July 6th. EXAM: PORTABLE ABDOMEN - 1 VIEW COMPARISON:  Plain film dated 05/25/2021. FINDINGS: Enteric tube in the stomach. Continued dilatation of the gas-filled small bowel loops in the LEFT abdomen, although perhaps minimally improved compared to yesterday's exam. Surgical staples are seen over the midline abdomen. Lung bases are clear. IMPRESSION: Continued evidence of partial small bowel obstruction versus ileus, although perhaps minimally improved compared to yesterday's exam. Enteric tube in the stomach. Electronically Signed   By: Franki Cabot M.D.   On: 05/26/2021 09:55    Anti-infectives: Anti-infectives (From admission, onward)    Start     Dose/Rate Route Frequency Ordered Stop   05/24/21 0000  cefTRIAXone (ROCEPHIN) 2 g in sodium chloride 0.9 % 100 mL IVPB        2 g 200 mL/hr over 30 Minutes Intravenous Every 24 hours 05/23/21 2317     05/24/21 0000  metroNIDAZOLE (FLAGYL) IVPB 500 mg        500 mg 100 mL/hr over 60 Minutes Intravenous Every 8 hours 05/23/21 2317     05/23/21 0600  ceFAZolin (ANCEF)  IVPB 3g/100 mL premix        3 g 200 mL/hr over 30 Minutes Intravenous On call to O.R. 05/22/21 1225 05/22/21 1501   05/22/21 1332  ceFAZolin (ANCEF) 3-0.9 GM/100ML-% IVPB       Note to Pharmacy: Abbie Sons   : cabinet override      05/22/21 1332 05/22/21 1434       Assessment/Plan: s/p Procedure(s): VENTRAL HERNIORRHAPHY WITH MESH LYSIS OF ADHESIONS     LOS: 7 days     Penny Garza is a 50 year old female status post-op day 5 following a ventral hernia repair with mesh and lysis of adhesions.  She is generally doing  well, so we will resume with thin liquids diet and continue introducing soups and other soft foods. Dr. Arnoldo Morale will discuss the new sensations she began to experience over the weekend with her today.  Danie Chandler 05/27/2021

## 2021-05-27 NOTE — Progress Notes (Signed)
PROGRESS NOTE    Penny Garza  YDX:[id number] DOB: 04/02/1971 DOA: 05/20/2021 PCP: [facility]   Brief Narrative:   Penny Garza is a 50 y.o. female with no significant past medical history presents to the ER for the second time in 5 days with complaints of nausea vomiting abdominal discomfort.  Patient was admitted with intractable nausea and vomiting with possible partial small bowel obstruction.  She is also noted to have some mild transaminitis with right upper quadrant ultrasound demonstrating some cholelithiasis and gallbladder sludge.  Patient has undergone ventral herniorrhaphy with mesh on 7/6 and continues to have abdominal pain, nausea and vomiting.  Assessment & Plan:   Principal Problem:   Nausea & vomiting Active Problems:   S/P TAH-BSO (total abdominal hysterectomy and bilateral salpingo-oophorectomy)   Abdominal pain   Elevated LFTs   Ventral hernia with obstruction and without gangrene   Small bowel obstruction due to adhesions (HCC)   Intractable nausea and vomiting with possible SBO and now postoperative ileus -Status post ventral herniorrhaphy on 7/6 -Appreciate ongoing assistance and postoperative care by general surgery -Patient reported improvement in abdominal discomfort and remains free of nausea or vomiting episodes. -She is passing gas; still no bowel movements. -Following recommendations by general surgery NG tube has been removed and patient's diet will continue to be slowly advance. -Continue to increase activity -Follow clinical response, electrolytes trend and continue as needed antiemetics and analgesics.  Fever -Resolved -No clear source of infection appreciated at this time -Antibiotics will be discontinued.   Mild transaminitis -stabilized currently -Can be followed further outpatient -Noted to have gallbladder sludge with cholelithiasis -Appreciate GI recommendations for follow-up in the outpatient setting in 4 weeks.   Recent total  abdominal hysterectomy -Appreciate gynecology recommendations -Continue outpatient follow-up after discharge.     Anemia -Hemoglobin has remained stable -No overt bleeding identified -Will continue monitoring CBC intermittently. -Last hemoglobin 8.8; in the setting most likely of hemodilution.   Morbid obesity -Low calorie diet, portion control and increase physical activity.     DVT prophylaxis: SCDs Code Status: Full Family Communication: None at bedside Disposition Plan:  Status is: Inpatient   Remains inpatient appropriate because:IV treatments appropriate due to intensity of illness or inability to take PO and Inpatient level of care appropriate due to severity of illness   Dispo: The patient is from: Home              Anticipated d/c is to: Home              Patient currently is not medically stable to d/c.              Difficult to place patient No     Consultants:  General surgery GI Gynecology   Procedures:  See below   Antimicrobials:  None   Subjective:  Afebrile, no chest pain, no shortness of breath, no nausea, no vomiting, passing gas.  NG tube has been removed and so far tolerating clear liquid/full liquid diet.  No BM reported  Objective: Vitals:   05/26/21 0523 05/26/21 1333 05/26/21 2101 05/27/21 0500  BP: 138/69 (!) 161/92 (!) 149/71 (!) 151/74  Pulse: 78 84 77 78  Resp:  18 19 19   Temp: 98.1 F (36.7 C) 98.7 F (37.1 C) 99.2 F (37.3 C) 98.6 F (37 C)  TempSrc: Oral Oral Oral Oral  SpO2: 100% 98% 99% 100%    Intake/Output Summary (Last 24 hours) at 05/27/2021 1151 Last data filed at 05/27/2021 0700  Gross per 24 hour  Intake 900 ml  Output --  Net 900 ml   There were no vitals filed for this visit.  Examination: General exam: Alert, awake, oriented x 3; NG tube has been removed.  Patient reports no nausea, no vomiting, passing gas and expressing feeling better and hungry. Still no BM's. Respiratory system: Clear to auscultation.  Respiratory effort normal. Cardiovascular system:RRR. No rubs or gallops. Gastrointestinal system: Abdomen is soft, with positive bowel sounds appreciated, Reporting tenderness to palpation around incision.  No guarding. Central nervous system: Alert and oriented. No focal neurological deficits. Extremities: No cyanosis or clubbing. Skin: No petechiae. Psychiatry: Judgement and insight appear normal. Mood & affect appropriate.    Data Reviewed: I have personally reviewed following labs and imaging studies  CBC: Recent Labs  Lab 05/20/21 1652 05/21/21 0645 05/22/21 0427 05/23/21 0619 05/23/21 2333 05/24/21 0608 05/25/21 0426  WBC 5.5 4.5 4.5 8.0 9.1 8.0 5.6  NEUTROABS 3.8 2.7  --   --  7.0 6.0  --   HGB 10.4* 9.4* 9.6* 10.9* 10.9* 10.5* 8.8*  HCT 34.8* 31.9* 32.8* 37.3 36.1 35.2* 30.5*  MCV 93.8 94.4 96.5 94.9 91.9 93.4 95.0  PLT 405* 364 375 358 398 383 767   Basic Metabolic Panel: Recent Labs  Lab 05/22/21 0427 05/23/21 0619 05/23/21 2333 05/24/21 0608 05/25/21 0426  NA 141 137 135 139 142  K 3.4* 3.8 3.2* 3.7 3.5  CL 107 102 102 104 110  CO2 23 25 23 26 25   GLUCOSE 93 120* 121* 121* 98  BUN 8 8 17 17 13   CREATININE 0.57 0.89 0.98 0.85 0.62  CALCIUM 9.4 9.2 8.8* 8.9 8.8*  MG 2.0 1.8 1.8  --  2.1  PHOS  --   --  3.1  --   --    GFR: Estimated Creatinine Clearance: 114.6 mL/min (by C-G formula based on SCr of 0.62 mg/dL).  Liver Function Tests: Recent Labs  Lab 05/21/21 0645 05/22/21 0427 05/23/21 0619 05/23/21 2333 05/24/21 0608  AST 70* 79* 66* 34 26  ALT 73* 80* 67* 53* 46*  ALKPHOS 120 130* 130* 130* 120  BILITOT 2.0* 1.8* 2.1* 2.9* 2.4*  PROT 7.2 7.6 7.5 8.1 7.5  ALBUMIN 3.6 3.7 3.7 3.7 3.5   Recent Labs  Lab 05/20/21 1652  LIPASE 20   Coagulation Profile: Recent Labs  Lab 05/23/21 2333  INR 1.2    Sepsis Labs: Recent Labs  Lab 05/23/21 2333  PROCALCITON <0.10  LATICACIDVEN 1.3    Recent Results (from the past 240 hour(s))   Resp Panel by RT-PCR (Flu A&B, Covid) Nasopharyngeal Swab     Status: None   Collection Time: 05/20/21 10:34 PM   Specimen: Nasopharyngeal Swab; Nasopharyngeal(NP) swabs in vial transport medium  Result Value Ref Range Status   SARS Coronavirus 2 by RT PCR NEGATIVE NEGATIVE Final    Comment: (NOTE) SARS-CoV-2 target nucleic acids are NOT DETECTED.  The SARS-CoV-2 RNA is generally detectable in upper respiratory specimens during the acute phase of infection. The lowest concentration of SARS-CoV-2 viral copies this assay can detect is 138 copies/mL. A negative result does not preclude SARS-Cov-2 infection and should not be used as the sole basis for treatment or other patient management decisions. A negative result may occur with  improper specimen collection/handling, submission of specimen other than nasopharyngeal swab, presence of viral mutation(s) within the areas targeted by this assay, and inadequate number of viral copies(<138 copies/mL). A negative result must be combined  with clinical observations, patient history, and epidemiological information. The expected result is Negative.  Fact Sheet for Patients:  EntrepreneurPulse.com.au  Fact Sheet for Healthcare Providers:  IncredibleEmployment.be  This test is no t yet approved or cleared by the [place] FDA and  has been authorized for detection and/or diagnosis of SARS-CoV-2 by FDA under an Emergency Use Authorization (EUA). This EUA will remain  in effect (meaning this test can be used) for the duration of the COVID-19 declaration under Section 564(b)(1) of the Act, 21 U.S.C.section 360bbb-3(b)(1), unless the authorization is terminated  or revoked sooner.       Influenza A by PCR NEGATIVE NEGATIVE Final   Influenza B by PCR NEGATIVE NEGATIVE Final    Comment: (NOTE) The Xpert Xpress SARS-CoV-2/FLU/RSV plus assay is intended as an aid in the diagnosis of influenza from  Nasopharyngeal swab specimens and should not be used as a sole basis for treatment. Nasal washings and aspirates are unacceptable for Xpert Xpress SARS-CoV-2/FLU/RSV testing.  Fact Sheet for Patients: EntrepreneurPulse.com.au  Fact Sheet for Healthcare Providers: IncredibleEmployment.be  This test is not yet approved or cleared by the [place] FDA and has been authorized for detection and/or diagnosis of SARS-CoV-2 by FDA under an Emergency Use Authorization (EUA). This EUA will remain in effect (meaning this test can be used) for the duration of the COVID-19 declaration under Section 564(b)(1) of the Act, 21 U.S.C. section 360bbb-3(b)(1), unless the authorization is terminated or revoked.  Performed at [facility], [address], [place], [place] [address]   Surgical PCR screen     Status: None   Collection Time: 05/22/21 12:53 AM   Specimen: Nasal Mucosa; Nasal Swab  Result Value Ref Range Status   MRSA, PCR NEGATIVE NEGATIVE Final   Staphylococcus aureus NEGATIVE NEGATIVE Final    Comment: (NOTE) The Xpert SA Assay (FDA approved for NASAL specimens in patients 58 years of age and older), is one component of a comprehensive surveillance program. It is not intended to diagnose infection nor to guide or monitor treatment. Performed at [facility], [address], [place], [place] [address]   Culture, blood (x 2)     Status: None (Preliminary result)   Collection Time: 05/23/21 11:33 PM   Specimen: BLOOD RIGHT HAND  Result Value Ref Range Status   Specimen Description BLOOD RIGHT HAND  Final   Special Requests   Final    BOTTLES DRAWN AEROBIC AND ANAEROBIC Blood Culture adequate volume   Culture   Final    NO GROWTH 4 DAYS Performed at [facility], [address], [place], [place] [address]    Report Status PENDING  Incomplete  Culture, blood (x 2)     Status: None (Preliminary result)   Collection Time: 05/23/21 11:42 PM    Specimen: BLOOD LEFT HAND  Result Value Ref Range Status   Specimen Description BLOOD LEFT HAND  Final   Special Requests   Final    BOTTLES DRAWN AEROBIC AND ANAEROBIC Blood Culture adequate volume   Culture   Final    NO GROWTH 4 DAYS Performed at [facility], [address], [place], [place] [address]    Report Status PENDING  Incomplete    Radiology Studies: DG Abd Portable 1V  Result Date: 05/26/2021 CLINICAL DATA:  Ileus after hernia repair surgery on July 6th. EXAM: PORTABLE ABDOMEN - 1 VIEW COMPARISON:  Plain film dated 05/25/2021. FINDINGS: Enteric tube in the stomach. Continued dilatation of the gas-filled small bowel loops in the  LEFT abdomen, although perhaps minimally improved compared to yesterday's exam. Surgical staples are seen over the midline abdomen. Lung bases are clear. IMPRESSION: Continued evidence of partial small bowel obstruction versus ileus, although perhaps minimally improved compared to yesterday's exam. Enteric tube in the stomach. Electronically Signed   By: Franki Cabot M.D.   On: 05/26/2021 09:55     Scheduled Meds:  enoxaparin (LOVENOX) injection  60 mg Subcutaneous Q24H   ondansetron (ZOFRAN) IV  4 mg Intravenous Once   pantoprazole (PROTONIX) IV  40 mg Intravenous Q12H     LOS: 7 days    Time spent: 35 minutes   Barton Dubois, MD [facility]  If 7PM-7AM, please contact night-coverage [email or web address] 05/27/2021, 11:51 AM

## 2021-05-28 DIAGNOSIS — Z90722 Acquired absence of ovaries, bilateral: Secondary | ICD-10-CM

## 2021-05-28 DIAGNOSIS — K565 Intestinal adhesions [bands], unspecified as to partial versus complete obstruction: Secondary | ICD-10-CM

## 2021-05-28 DIAGNOSIS — Z9079 Acquired absence of other genital organ(s): Secondary | ICD-10-CM

## 2021-05-28 DIAGNOSIS — Z90710 Acquired absence of both cervix and uterus: Secondary | ICD-10-CM

## 2021-05-28 LAB — CULTURE, BLOOD (ROUTINE X 2)
Culture: NO GROWTH
Culture: NO GROWTH
Special Requests: ADEQUATE
Special Requests: ADEQUATE

## 2021-05-28 MED ORDER — BISACODYL 10 MG RE SUPP
10.0000 mg | Freq: Once | RECTAL | Status: AC
  Administered 2021-05-28: 10 mg via RECTAL
  Filled 2021-05-28: qty 1

## 2021-05-28 MED ORDER — POLYETHYLENE GLYCOL 3350 17 G PO PACK
17.0000 g | PACK | Freq: Every day | ORAL | Status: DC
  Administered 2021-05-28 – 2021-05-29 (×2): 17 g via ORAL
  Filled 2021-05-28 (×2): qty 1

## 2021-05-28 NOTE — Progress Notes (Addendum)
[facility] Progress Note  6 Days Post-Op   Subjective: Penny Garza is a 50 year old female status post-op day 6 following a ventral hernia repair with mesh and lysis of adhesions. She says she is doing well.   The nausea, vomiting, and side cramping have subsided, and she had no overnight events. She had muscle spasms yesterday. The spasms were constant, and she felt like she was "experiencing a crunch all day". She was walking bent over and had difficulty lifting her arms above her head, but she was given milk of magnesia, and that provided significant relief.   She says she still has spasms occassionally, but she stops moving, and the pain goes away. She had cramping pain while I was in the room around 8:03am that lasted for about 2 minutes, but she described that as stomach pain as opposed to muscle pain/spasms.  She has been walking. She has been eating mashed potatoes since last night and drinking water and juice. She has not had another bowel movement since Friday but really desires one.   Objective: Vital signs in last 24 hours: Temp:  [98.2 F (36.8 C)-98.5 F (36.9 C)] 98.2 F (36.8 C) (07/12 0458) Pulse Rate:  [69-78] 78 (07/12 0458) Resp:  [18-20] 19 (07/12 0458) BP: (143-163)/(75-84) 143/75 (07/12 0458) SpO2:  [100 %] 100 % (07/12 0458) Last BM Date: 05/21/21  Intake/Output from previous day: 07/11 0701 - 07/12 0700 In: 480 [P.O.:480] Out: -  Intake/Output this shift: No intake/output data recorded.  General appearance: alert, cooperative, and mild distress Head: Normocephalic, without obvious abnormality, atraumatic Resp: Normal breath sounds, no increased work of breathing Cardio: regular rate and rhythm GI: normal findings: abdomen is tender Skin: increased sensation around the staples Incision/Wound: wound was clean and intact at last wound check   Lab Results:  No results for input(s): WBC, HGB, HCT, PLT in the last 72  hours. BMET No results for input(s): NA, K, CL, CO2, GLUCOSE, BUN, CREATININE, CALCIUM in the last 72 hours. PT/INR No results for input(s): LABPROT, INR in the last 72 hours.  Studies/Results: No results found.  Anti-infectives: Anti-infectives (From admission, onward)    Start     Dose/Rate Route Frequency Ordered Stop   05/24/21 0000  cefTRIAXone (ROCEPHIN) 2 g in sodium chloride 0.9 % 100 mL IVPB  Status:  Discontinued        2 g 200 mL/hr over 30 Minutes Intravenous Every 24 hours 05/23/21 2317 05/27/21 1150   05/24/21 0000  metroNIDAZOLE (FLAGYL) IVPB 500 mg  Status:  Discontinued        500 mg 100 mL/hr over 60 Minutes Intravenous Every 8 hours 05/23/21 2317 05/27/21 1150   05/23/21 0600  ceFAZolin (ANCEF) IVPB 3g/100 mL premix        3 g 200 mL/hr over 30 Minutes Intravenous On call to O.R. 05/22/21 1225 05/22/21 1501   05/22/21 1332  ceFAZolin (ANCEF) 3-0.9 GM/100ML-% IVPB       Note to Pharmacy: Abbie Sons   : cabinet override      05/22/21 1332 05/22/21 1434       Assessment/Plan: s/p Procedure(s): VENTRAL HERNIORRHAPHY WITH MESH LYSIS OF ADHESIONS    LOS: 8 days    Penny Garza is a 50 year old female status post-op day 6 following a ventral hernia repair with mesh and lysis of adhesions.  She is generally doing well. We will give her a suppository and Miralax because she really hopes to have a bowel  movement today. We will continue advancing the diet. She is walking, so after she has another bowel movement and tolerates food, she should be ready for discharge.   Danie Chandler 05/28/2021   Agree with assessment.  Patient personally seen.

## 2021-05-28 NOTE — Progress Notes (Signed)
) PROGRESS NOTE    Penny Garza  DZH:[id number] DOB: 1971/02/23 DOA: 05/20/2021 PCP: [facility]   Brief Narrative:   Penny Garza is a 50 y.o. female with no significant past medical history presents to the ER for the second time in 5 days with complaints of nausea vomiting abdominal discomfort.  Patient was admitted with intractable nausea and vomiting with possible partial small bowel obstruction.  She is also noted to have some mild transaminitis with right upper quadrant ultrasound demonstrating some cholelithiasis and gallbladder sludge.  Patient has undergone ventral herniorrhaphy with mesh on 7/6 and continues to have abdominal pain, nausea and vomiting.  Assessment & Plan:   Principal Problem:   Nausea & vomiting Active Problems:   S/P TAH-BSO (total abdominal hysterectomy and bilateral salpingo-oophorectomy)   Abdominal pain   Elevated LFTs   Ventral hernia with obstruction and without gangrene   Small bowel obstruction due to adhesions (HCC)   Intractable nausea and vomiting with possible SBO and now postoperative ileus -Status post ventral herniorrhaphy on 7/6 -Appreciate ongoing assistance and postoperative care by general surgery -She is passing gas; still no bowel movements. -no nausea or vomiting, continue advancing diet. -miralax and dulcolax suppository ordered by general surgery -Continue to increase activity and follow response. -Follow clinical response, electrolytes trend and continue as needed antiemetics and analgesics.  Fever -Resolved -No clear source of infection appreciated at this time -Antibiotics will be discontinued.   Mild transaminitis -stabilized currently -Can be followed further outpatient -Noted to have gallbladder sludge with cholelithiasis -Appreciate GI recommendations for follow-up in the outpatient setting in 4 weeks.   Recent total abdominal hysterectomy -Appreciate gynecology recommendations -Continue outpatient follow-up after  discharge.     Anemia -Hemoglobin has remained stable -No overt bleeding identified -Will continue monitoring CBC intermittently. -Last hemoglobin 8.8; in the setting most likely of hemodilution.   Morbid obesity -Low calorie diet, portion control and increase physical activity.     DVT prophylaxis: SCDs Code Status: Full Family Communication: None at bedside Disposition Plan:  Status is: Inpatient   Remains inpatient appropriate because:IV treatments appropriate due to intensity of illness or inability to take PO and Inpatient level of care appropriate due to severity of illness   Dispo: The patient is from: Home              Anticipated d/c is to: Home              Patient currently is not medically stable to d/c.              Difficult to place patient No     Consultants:  General surgery GI Gynecology   Procedures:  See below   Antimicrobials:  None   Subjective:  No fever, no chest pain, no nausea or vomiting.  Patient is tolerating full liquid diet.  Still without bowel movements.  Passing gas.  Objective: Vitals:   05/27/21 1320 05/27/21 2003 05/28/21 0458 05/28/21 1417  BP: (!) 163/79 (!) 159/84 (!) 143/75 131/72  Pulse: 71 69 78 82  Resp: 20 18 19 18   Temp: 98.4 F (36.9 C) 98.5 F (36.9 C) 98.2 F (36.8 C) 98.2 F (36.8 C)  TempSrc: Oral Oral Oral Oral  SpO2: 100% 100% 100% 96%    Intake/Output Summary (Last 24 hours) at 05/28/2021 1628 Last data filed at 05/28/2021 1300 Gross per 24 hour  Intake 360 ml  Output 1 ml  Net 359 ml   There were no vitals  filed for this visit.  Examination: General exam: Alert, awake, oriented x 3; no nausea or vomiting.  Tolerating full liquid diet.  Still with intermittent episode of continued passing gas but no bowel movement. Respiratory system: Clear to auscultation. Respiratory effort normal.  No using accessory muscles.  Good saturation on room air. Cardiovascular system:RRR. No murmurs, rubs,  gallops. Gastrointestinal system: Abdomen is soft, tender to palpation around incision as expected; positive bowel sounds, no guarding. Central nervous system: Alert and oriented. No focal neurological deficits. Extremities: No cyanosis or clubbing. Skin: No petechiae; mid abdominal incision clean, dry and intact. Psychiatry: Judgement and insight appear normal. Mood & affect appropriate.    Data Reviewed: I have personally reviewed following labs and imaging studies  CBC: Recent Labs  Lab 05/22/21 0427 05/23/21 0619 05/23/21 2333 05/24/21 0608 05/25/21 0426  WBC 4.5 8.0 9.1 8.0 5.6  NEUTROABS  --   --  7.0 6.0  --   HGB 9.6* 10.9* 10.9* 10.5* 8.8*  HCT 32.8* 37.3 36.1 35.2* 30.5*  MCV 96.5 94.9 91.9 93.4 95.0  PLT 375 358 398 383 654   Basic Metabolic Panel: Recent Labs  Lab 05/22/21 0427 05/23/21 0619 05/23/21 2333 05/24/21 0608 05/25/21 0426  NA 141 137 135 139 142  K 3.4* 3.8 3.2* 3.7 3.5  CL 107 102 102 104 110  CO2 23 25 23 26 25   GLUCOSE 93 120* 121* 121* 98  BUN 8 8 17 17 13   CREATININE 0.57 0.89 0.98 0.85 0.62  CALCIUM 9.4 9.2 8.8* 8.9 8.8*  MG 2.0 1.8 1.8  --  2.1  PHOS  --   --  3.1  --   --    GFR: Estimated Creatinine Clearance: 114.6 mL/min (by C-G formula based on SCr of 0.62 mg/dL).  Liver Function Tests: Recent Labs  Lab 05/22/21 0427 05/23/21 0619 05/23/21 2333 05/24/21 0608  AST 79* 66* 34 26  ALT 80* 67* 53* 46*  ALKPHOS 130* 130* 130* 120  BILITOT 1.8* 2.1* 2.9* 2.4*  PROT 7.6 7.5 8.1 7.5  ALBUMIN 3.7 3.7 3.7 3.5   No results for input(s): LIPASE, AMYLASE in the last 168 hours.  Coagulation Profile: Recent Labs  Lab 05/23/21 2333  INR 1.2    Sepsis Labs: Recent Labs  Lab 05/23/21 2333  PROCALCITON <0.10  LATICACIDVEN 1.3    Recent Results (from the past 240 hour(s))  Resp Panel by RT-PCR (Flu A&B, Covid) Nasopharyngeal Swab     Status: None   Collection Time: 05/20/21 10:34 PM   Specimen: Nasopharyngeal Swab;  Nasopharyngeal(NP) swabs in vial transport medium  Result Value Ref Range Status   SARS Coronavirus 2 by RT PCR NEGATIVE NEGATIVE Final    Comment: (NOTE) SARS-CoV-2 target nucleic acids are NOT DETECTED.  The SARS-CoV-2 RNA is generally detectable in upper respiratory specimens during the acute phase of infection. The lowest concentration of SARS-CoV-2 viral copies this assay can detect is 138 copies/mL. A negative result does not preclude SARS-Cov-2 infection and should not be used as the sole basis for treatment or other patient management decisions. A negative result may occur with  improper specimen collection/handling, submission of specimen other than nasopharyngeal swab, presence of viral mutation(s) within the areas targeted by this assay, and inadequate number of viral copies(<138 copies/mL). A negative result must be combined with clinical observations, patient history, and epidemiological information. The expected result is Negative.  Fact Sheet for Patients:  EntrepreneurPulse.com.au  Fact Sheet for Healthcare Providers:  IncredibleEmployment.be  This test is no t yet approved or cleared by the [place] and  has been authorized for detection and/or diagnosis of SARS-CoV-2 by FDA under an Emergency Use Authorization (EUA). This EUA will remain  in effect (meaning this test can be used) for the duration of the COVID-19 declaration under Section 564(b)(1) of the Act, 21 U.S.C.section 360bbb-3(b)(1), unless the authorization is terminated  or revoked sooner.       Influenza A by PCR NEGATIVE NEGATIVE Final   Influenza B by PCR NEGATIVE NEGATIVE Final    Comment: (NOTE) The Xpert Xpress SARS-CoV-2/FLU/RSV plus assay is intended as an aid in the diagnosis of influenza from Nasopharyngeal swab specimens and should not be used as a sole basis for treatment. Nasal washings and aspirates are unacceptable for Xpert Xpress  SARS-CoV-2/FLU/RSV testing.  Fact Sheet for Patients: EntrepreneurPulse.com.au  Fact Sheet for Healthcare Providers: IncredibleEmployment.be  This test is not yet approved or cleared by the [place] FDA and has been authorized for detection and/or diagnosis of SARS-CoV-2 by FDA under an Emergency Use Authorization (EUA). This EUA will remain in effect (meaning this test can be used) for the duration of the COVID-19 declaration under Section 564(b)(1) of the Act, 21 U.S.C. section 360bbb-3(b)(1), unless the authorization is terminated or revoked.  Performed at [facility], [address], [place], [place] [address]   Surgical PCR screen     Status: None   Collection Time: 05/22/21 12:53 AM   Specimen: Nasal Mucosa; Nasal Swab  Result Value Ref Range Status   MRSA, PCR NEGATIVE NEGATIVE Final   Staphylococcus aureus NEGATIVE NEGATIVE Final    Comment: (NOTE) The Xpert SA Assay (FDA approved for NASAL specimens in patients 17 years of age and older), is one component of a comprehensive surveillance program. It is not intended to diagnose infection nor to guide or monitor treatment. Performed at [facility], [address], [place], [place] [address]   Culture, blood (x 2)     Status: None   Collection Time: 05/23/21 11:33 PM   Specimen: BLOOD RIGHT HAND  Result Value Ref Range Status   Specimen Description BLOOD RIGHT HAND  Final   Special Requests   Final    BOTTLES DRAWN AEROBIC AND ANAEROBIC Blood Culture adequate volume   Culture   Final    NO GROWTH 5 DAYS Performed at [facility], [address] [place], [place] [address]    Report Status 05/28/2021 FINAL  Final  Culture, blood (x 2)     Status: None   Collection Time: 05/23/21 11:42 PM   Specimen: BLOOD LEFT HAND  Result Value Ref Range Status   Specimen Description BLOOD LEFT HAND  Final   Special Requests   Final    BOTTLES DRAWN AEROBIC AND ANAEROBIC Blood Culture  adequate volume   Culture   Final    NO GROWTH 5 DAYS Performed at [facility], [address], [place],  [address]    Report Status 05/28/2021 FINAL  Final    Radiology Studies: No results found.   Scheduled Meds:  enoxaparin (LOVENOX) injection  60 mg Subcutaneous Q24H   ondansetron (ZOFRAN) IV  4 mg Intravenous Once   pantoprazole (PROTONIX) IV  40 mg Intravenous Q12H   polyethylene glycol  17 g Oral Daily     LOS: 8 days    Time spent: 35 minutes   Barton Dubois, MD [facility]  If 7PM-7AM, please contact night-coverage [email or web address] 05/28/2021, 4:28 PM

## 2021-05-29 LAB — BASIC METABOLIC PANEL
Anion gap: 9 (ref 5–15)
BUN: 9 mg/dL (ref 6–20)
CO2: 25 mmol/L (ref 22–32)
Calcium: 8.9 mg/dL (ref 8.9–10.3)
Chloride: 104 mmol/L (ref 98–111)
Creatinine, Ser: 0.6 mg/dL (ref 0.44–1.00)
GFR, Estimated: >60 mL/min (ref >60)
Glucose, Bld: 106 mg/dL — ABNORMAL HIGH (ref 70–99)
Potassium: 2.8 mmol/L — ABNORMAL LOW (ref 3.5–5.1)
Sodium: 138 mmol/L (ref 135–145)

## 2021-05-29 LAB — CBC
HCT: 30 % — ABNORMAL LOW (ref 36.0–46.0)
Hemoglobin: 9 g/dL — ABNORMAL LOW (ref 12.0–15.0)
MCH: 27.4 pg (ref 26.0–34.0)
MCHC: 30 g/dL (ref 30.0–36.0)
MCV: 91.2 fL (ref 80.0–100.0)
Platelets: 312 10*3/uL (ref 150–400)
RBC: 3.29 MIL/uL — ABNORMAL LOW (ref 3.87–5.11)
RDW: 15.8 % — ABNORMAL HIGH (ref 11.5–15.5)
WBC: 5 10*3/uL (ref 4.0–10.5)
nRBC: 0 % (ref 0.0–0.2)

## 2021-05-29 MED ORDER — TRAMADOL HCL 50 MG PO TABS: 50.0000 mg | ORAL_TABLET | Freq: Four times a day (QID) | ORAL | 0 refills | Status: DC | PRN

## 2021-05-29 NOTE — Discharge Summary (Signed)
Physician Discharge Summary  Patient ID: Penny Garza MRN: [id number] DOB/AGE: [birth year] 51 y.o.  Admit date: 05/20/2021 Discharge date: 05/29/2021  Admission Diagnoses: Partial small bowel obstruction, ventral hernia, transaminitis  Discharge Diagnoses: Same Principal Problem:   Nausea & vomiting Active Problems:   S/P TAH-BSO (total abdominal hysterectomy and bilateral salpingo-oophorectomy)   Abdominal pain   Elevated LFTs   Ventral hernia with obstruction and without gangrene   Small bowel obstruction due to adhesions (HCC) Cholelithiasis, postoperative ileus  Discharged Condition: good  Hospital Course: Patient is a 50 year old white female who presented to the emergency room on 05/20/2021 with worsening abdominal pain, nausea, and vomiting.  She was status post total abdominal hysterectomy on 05/01/2021.  She was noted on CT scan of the abdomen to have 2 ventral hernia superior to the Pfannenstiel incision as well as cholelithiasis.  She underwent ultrasound the gallbladder which revealed biliary sludge and some's stones.  She also had a fatty liver.  Given her overall state, I felt that her primary issues were with her ventral hernia and adhesive disease, thus I wanted to defer any further possible cholecystectomy at this time. It was felt that her symptoms were primarily due to her ventral hernia and adhesive disease in her abdomen causing a partial small bowel obstruction.  She was taken to the operating room on 05/22/2021 and underwent exploratory laparotomy, lysis of adhesions, and ventral herniorrhaphy with mesh.  She tolerated the surgery well.  Her postoperative course was remarkable for postoperative ileus.  This subsequently resolved and her diet was advanced.  She is being discharged home on 05/29/2021 in good and improving condition.  Treatments: surgery: Exploratory laparotomy, lysis of adhesions, ventral herniorrhaphy with mesh on 05/22/2021  Discharge Exam: Blood pressure 137/67,  pulse 72, temperature 98.4 F (36.9 C), temperature source Oral, resp. rate 20, last menstrual period 02/27/2021, SpO2 100 %. General appearance: alert, cooperative, and no distress Resp: clear to auscultation bilaterally Cardio: regular rate and rhythm, S1, S2 normal, no murmur, click, rub or gallop GI: Soft, incision healing well.  Disposition: Discharge disposition: 01-Home or Self Care       Discharge Instructions     Diet - low sodium heart healthy   Complete by: As directed    Increase activity slowly   Complete by: As directed       Allergies as of 05/29/2021       Reactions   Codeine Anaphylaxis   Iron Sucrose Rash   Azithromycin    Milk-related Compounds Other (See Comments)   Stomach pain   Other Dermatitis   Certain metals         Medication List     STOP taking these medications    oxyCODONE 5 MG immediate release tablet Commonly known as: Oxy IR/ROXICODONE       TAKE these medications    dicyclomine 20 MG tablet Commonly known as: BENTYL Take 1 tablet (20 mg total) by mouth 3 (three) times daily as needed.   ferrous sulfate 325 (65 FE) MG tablet Take 325 mg by mouth daily with breakfast.   ibuprofen 600 MG tablet Commonly known as: ADVIL Take 1 tablet (600 mg total) by mouth every 6 (six) hours.   ondansetron 8 MG tablet Commonly known as: ZOFRAN Take 1 tablet (8 mg total) by mouth every 6 (six) hours as needed for nausea.   traMADol 50 MG tablet Commonly known as: Ultram Take 1 tablet (50 mg total) by mouth every 6 (six) hours as needed.  Follow-up Information     Aviva Signs, MD. Schedule an appointment as soon as possible for a visit on 06/06/2021.   Specialty: General Surgery Contact information: [id number] [address] [phone]                 Signed: Aviva Signs 05/29/2021, 9:58 AM

## 2021-05-29 NOTE — Progress Notes (Signed)
Patient seen and examined. Had BM and is tolerating diet. Hemodynamically stable and in no distress. After discussing with general surgery they agree with taking over patient care and discharge her home. Patient will follow up with general surgery and PCP as an outpatient.  Barton Dubois MD [phone]

## 2021-06-06 ENCOUNTER — Ambulatory Visit (INDEPENDENT_AMBULATORY_CARE_PROVIDER_SITE_OTHER): Payer: 59 | Admitting: General Surgery

## 2021-06-06 ENCOUNTER — Other Ambulatory Visit: Payer: Self-pay

## 2021-06-06 ENCOUNTER — Encounter: Payer: Self-pay | Admitting: General Surgery

## 2021-06-06 VITALS — BP 141/89 | HR 80 | Temp 98.6°F | Resp 16 | Ht 66.5 in | Wt 260.0 lb

## 2021-06-06 DIAGNOSIS — K436 Other and unspecified ventral hernia with obstruction, without gangrene: Secondary | ICD-10-CM

## 2021-06-06 NOTE — Progress Notes (Signed)
[facility] Note   HPI:  50 y.o. Female presents to clinic for post-op follow-up evaluation after ventral hernia repair with mesh. She is doing well and having soreness. She otherwise feels well. She says that she was out on FMLA for the hysterectomy and that she is suppose to return 06/17/2021.   She is eating and having Bms.   Review of Systems:  No fever No erythema or drainage Having soreness  All other review of systems: otherwise negative   Vital Signs:  BP (!) 141/89   Pulse 80   Temp 98.6 F (37 C) (Other (Comment))   Resp 16   Ht 5' 6.5" (1.689 m)   Wt 260 lb (117.9 kg)   LMP 02/27/2021 (Approximate)   SpO2 96%   BMI 41.34 kg/m    Physical Exam:  Physical Exam Vitals reviewed.  Cardiovascular:     Rate and Rhythm: Normal rate.  Pulmonary:     Effort: Pulmonary effort is normal.  Abdominal:     General: There is no distension.     Palpations: Abdomen is soft.     Tenderness: There is no abdominal tenderness.     Hernia: No hernia is present.     Comments: Staples removed, no erythema or drainage, steri strips applied  Neurological:     Mental Status: She is alert.      Assessment:  50 y.o. yo Female s/p ventral hernia repair with mesh. 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   Plan:  No heavy lifting > 10 lbs, excessive bending, pushing, pulling, or squatting for 6 weeks after surgery.  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   Steri strips will peel up after 5-7 days, ok to remove then. Ok to shower. Do not submerge in tube.   Future Appointments  Date Time Provider [place]  06/17/2021  9:10 AM Florian Buff, MD [facility]  06/26/2021  2:00 PM Harvel Quale, MD [facility] None  07/09/2021  1:15 PM Aviva Signs, MD [facility] None       Curlene Labrum, MD [facility] [address] [place], [place] [address] [phone] (office)

## 2021-06-06 NOTE — Patient Instructions (Addendum)
No heavy lifting > 10 lbs, excessive bending, pushing, pulling, or squatting for 6 weeks after surgery.  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   Steri strips will peel up after 5-7 days, ok to remove then. Ok to shower. Do not submerge in tube.

## 2021-06-17 ENCOUNTER — Other Ambulatory Visit: Payer: Self-pay

## 2021-06-17 ENCOUNTER — Ambulatory Visit (INDEPENDENT_AMBULATORY_CARE_PROVIDER_SITE_OTHER): Payer: 59 | Admitting: Obstetrics & Gynecology

## 2021-06-17 ENCOUNTER — Encounter: Payer: Self-pay | Admitting: Obstetrics & Gynecology

## 2021-06-17 VITALS — BP 155/95 | HR 77 | Ht 67.5 in | Wt 264.0 lb

## 2021-06-17 DIAGNOSIS — Z90722 Acquired absence of ovaries, bilateral: Secondary | ICD-10-CM

## 2021-06-17 DIAGNOSIS — Z9079 Acquired absence of other genital organ(s): Secondary | ICD-10-CM

## 2021-06-17 DIAGNOSIS — Z90710 Acquired absence of both cervix and uterus: Secondary | ICD-10-CM

## 2021-06-17 NOTE — Progress Notes (Signed)
  HPI: Patient returns for routine postoperative follow-up having undergone Abdominal hysterectomy, supracervical with bilateral salpingo-oophorectomy, extensive lysis of adhesions on 05/01/21.  The patient's immediate postoperative recovery has been unremarkable. Since hospital discharge the patient reports had abdominal wall hernia with incarceration and SBO due to adhesions   Current Outpatient Medications: dicyclomine (BENTYL) 20 MG tablet, Take 1 tablet (20 mg total) by mouth 3 (three) times daily as needed., Disp: 20 tablet, Rfl: 0 ferrous sulfate 325 (65 FE) MG tablet, Take 325 mg by mouth daily with breakfast. (Patient not taking: Reported on 06/17/2021), Disp: , Rfl:   No current facility-administered medications for this visit.    Blood pressure (!) 155/95, pulse 77, height 5' 7.5" (1.715 m), weight 264 lb (119.7 kg), last menstrual period 02/27/2021.  Physical Exam: 2 incisions clean dry intact Well healed  Benign abdominal exam  Diagnostic Tests:   Pathology: benign  Impression:    ICD-10-CM   1. S/P TAH-BSO (total abdominal hysterectomy and bilateral salpingo-oophorectomy), 05/01/21  Z90.710    Z90.722    Z90.79        Plan:     Follow up: Return if symptoms worsen or fail to improve.    Florian Buff, MD op

## 2021-06-26 ENCOUNTER — Ambulatory Visit (INDEPENDENT_AMBULATORY_CARE_PROVIDER_SITE_OTHER): Payer: 59 | Admitting: Gastroenterology

## 2021-06-26 ENCOUNTER — Ambulatory Visit (INDEPENDENT_AMBULATORY_CARE_PROVIDER_SITE_OTHER): Payer: No Typology Code available for payment source | Admitting: Gastroenterology

## 2021-06-26 ENCOUNTER — Encounter (INDEPENDENT_AMBULATORY_CARE_PROVIDER_SITE_OTHER): Payer: Self-pay | Admitting: Gastroenterology

## 2021-06-26 ENCOUNTER — Other Ambulatory Visit: Payer: Self-pay

## 2021-06-26 VITALS — BP 129/76 | HR 109 | Temp 98.7°F | Ht 67.5 in | Wt 265.0 lb

## 2021-06-26 DIAGNOSIS — R7989 Other specified abnormal findings of blood chemistry: Secondary | ICD-10-CM

## 2021-06-26 DIAGNOSIS — K436 Other and unspecified ventral hernia with obstruction, without gangrene: Secondary | ICD-10-CM

## 2021-06-26 DIAGNOSIS — K5900 Constipation, unspecified: Secondary | ICD-10-CM

## 2021-06-26 DIAGNOSIS — K7581 Nonalcoholic steatohepatitis (NASH): Secondary | ICD-10-CM | POA: Insufficient documentation

## 2021-06-26 DIAGNOSIS — K565 Intestinal adhesions [bands], unspecified as to partial versus complete obstruction: Secondary | ICD-10-CM

## 2021-06-26 NOTE — Patient Instructions (Signed)
Start taking Miralax 1 capful every 12 hours. If after two weeks there is no improvement, increase to 1 capful every 8 hours Perform blood workup  Patient was counseled about the benefit of implementing a Mediterranean diet and exercise plan to decrease at least 5% of weight. The patient understood about the importance of lifestyle changes to potentially reverse liver involvement.

## 2021-06-26 NOTE — Progress Notes (Signed)
Maylon Peppers, M.D. Gastroenterology & Hepatology [facility] [address] [place], [place] [address]  Primary Care Physician: [place] Burnett [place] [phone]  I will communicate my assessment and recommendations to the referring MD via EMR.  Problems: Small bowel obstruction due to adhesions  History of Present Illness: Penny Garza is a 50 year old female with past medical history of iron deficiency anemia due to menorrhagia status post hysterectomy with bilateral salpingectomy, who presents for follow up after recent hospitalization for small bowel obstruction due to multiple adhesions.  The patient was last seen on 05/21/2021 when she was hospitalized at [facility] due to small bowel obstruction. At that time, the patient was seen in the hospital after she presented with acute onset abdominal pain.  CT of the abdomen and pelvis showed presence of periumbilical/umbilical hernia with presence of an interbowel development of a small bowel obstruction compared to a CT scan performed a few days earlier.  The patient was evaluated by general surgery who performed a ventral herniorrhaphy with mesh and lysis of adhesions on 05/22/2021 (this was performed by Dr. Aviva Signs).  Patient reports that she feels nauseated when "something touches her umbilical area". Has some pain when she touches that area, but also when she leans on her side.  However, overall she feels better than when she came to the ER she reported that this is part of her recovery process.  She states she has been having persistent constipation. She is having a BM twice a week, but she feels recurrent episodes of tenesmus without defecation during the week.  She is currently taking 1 capful of MiraLAX every day.  Does not take any other laxatives.  The patient denies having any nausea, vomiting, fever, chills, hematochezia, melena, hematemesis,  abdominal distention, diarrhea, jaundice, pruritus . Gained some weight recently as she has been using prednisone intermittently prior to her iron infusion.  She reports that she has been presenting persistently low iron and has been receiving IV iron with prednisone premedication.  Notably, during her most recent hospitalization she was found to have elevated aminotransferases without significant alteration of her alkaline phosphatase.  She was also found to have increased total bilirubin.  Upon review of her medical record, in June her liver enzymes were completely normal but she presented sudden increase in her aminotransferases upon admission to the ER with AST of 84, ALT of 80, total bilirubin of 1.8.  Her most recent labs on 05/24/2021 prior to discharge showed improvement of the aminotransferases with AST of 26, ALT of 46 but bilirubin still mildly elevated up to 2.4.  During her hospitalization she underwent a liver ultrasound that showed findings suggestive of hepatic cytosis but no other.  Alcohol abnormalities or findings consistent with cirrhosis.  Last EGD: many years ago, she believes she had an ulcer and H. Pylori, reports that she was treated for it Last Colonoscopy: 2019 - no polyps per patient. Had previously polyps and was advised to have repeat colonoscopy this year. Patient had a capsule endoscopy in [place], [place] for evalaution of IDA, she reports it was normal  Past Medical History: Past Medical History:  Diagnosis Date   Anemia    Complication of anesthesia    Patient woke up during procedure    Past Surgical History: Past Surgical History:  Procedure Laterality Date   BILATERAL SALPINGECTOMY Bilateral 05/01/2021   Procedure: OPEN BILATERAL SALPINGECTOMY and OOPHORECTOMY;  Surgeon: Florian Buff, MD;  Location: [facility];  Service: Gynecology;  Laterality: Bilateral;   COLONOSCOPY     ENDOMETRIAL BIOPSY     IUD REMOVAL     LAPAROSCOPIC LYSIS OF ADHESIONS N/A 05/22/2021    Procedure: LYSIS OF ADHESIONS;  Surgeon: Aviva Signs, MD;  Location: [facility];  Service: General;  Laterality: N/A;   SUPRACERVICAL ABDOMINAL HYSTERECTOMY N/A 05/01/2021   Procedure: HYSTERECTOMY SUPRACERVICAL ABDOMINAL;  Surgeon: Florian Buff, MD;  Location: [facility];  Service: Gynecology;  Laterality: N/A;   VENTRAL HERNIA REPAIR N/A 05/22/2021   Procedure: VENTRAL HERNIORRHAPHY WITH MESH;  Surgeon: Aviva Signs, MD;  Location: [facility];  Service: General;  Laterality: N/A;    Family History: Family History  Problem Relation Age of Onset   Heart attack Mother    Diabetes Father    Hypertension Father    Alcohol abuse Father    Diabetes Maternal Grandfather    Diabetes Paternal Grandmother    Diabetes Paternal Grandfather    Colon cancer Neg Hx     Social History: Social History   Tobacco Use  Smoking Status Never  Smokeless Tobacco Never   Social History   Substance and Sexual Activity  Alcohol Use Not Currently   Social History   Substance and Sexual Activity  Drug Use Never    Allergies: Allergies  Allergen Reactions   Codeine Anaphylaxis   Iron Sucrose Rash   Azithromycin    Milk-Related Compounds Other (See Comments)    Stomach pain   Other Dermatitis    Certain metals     Medications: Current Outpatient Medications  Medication Sig Dispense Refill   dicyclomine (BENTYL) 20 MG tablet Take 1 tablet (20 mg total) by mouth 3 (three) times daily as needed. 20 tablet 0   PRESCRIPTION MEDICATION Pt thinks she is taking prednisone, famotide and another med due to having allergic reaction to iron. Got meds from [place]     ferrous sulfate 325 (65 FE) MG tablet Take 325 mg by mouth daily with breakfast. (Patient not taking: No sig reported)     No current facility-administered medications for this visit.    Review of Systems: GENERAL: negative for malaise, night sweats HEENT: No changes in hearing or vision, no nose bleeds or other nasal problems. NECK: Negative for  lumps, goiter, pain and significant neck swelling RESPIRATORY: Negative for cough, wheezing CARDIOVASCULAR: Negative for chest pain, leg swelling, palpitations, orthopnea GI: SEE HPI MUSCULOSKELETAL: Negative for joint pain or swelling, back pain, and muscle pain. SKIN: Negative for lesions, rash PSYCH: Negative for sleep disturbance, mood disorder and recent psychosocial stressors. HEMATOLOGY Negative for prolonged bleeding, bruising easily, and swollen nodes. ENDOCRINE: Negative for cold or heat intolerance, polyuria, polydipsia and goiter. NEURO: negative for tremor, gait imbalance, syncope and seizures. The remainder of the review of systems is noncontributory.   Physical Exam: BP 129/76 (BP Location: Right Arm, Patient Position: Sitting, Cuff Size: Large)   Pulse (!) 109   Temp 98.7 F (37.1 C) (Oral)   Ht 5' 7.5" (1.715 m)   Wt 265 lb (120.2 kg)   LMP 02/27/2021 (Approximate)   BMI 40.89 kg/m  GENERAL: The patient is AO x3, in no acute distress. Obese. HEENT: Head is normocephalic and atraumatic. EOMI are intact. Mouth is well hydrated and without lesions. NECK: Supple. No masses LUNGS: Clear to auscultation. No presence of rhonchi/wheezing/rales. Adequate chest expansion HEART: RRR, normal s1 and s2. ABDOMEN: Soft, nontender, no guarding, no peritoneal signs, and nondistended. BS +. No masses. EXTREMITIES:  Without any cyanosis, clubbing, rash, lesions or edema. NEUROLOGIC: AOx3, no focal motor deficit. SKIN: no jaundice, no rashes  Imaging/Labs: as above  I personally reviewed and interpreted the available labs, imaging and endoscopic files.  Impression and Plan: Penny Garza is a 50 year old female with past medical history of iron deficiency anemia due to menorrhagia status post hysterectomy with bilateral salpingectomy, who presents for follow up after recent hospitalization for small bowel obstruction due to multiple adhesions.  The patient underwent surgical repair  of her hernia but also had lysis of additions with resolution of her symptoms.  She has been able to advance her diet and has had adequate postoperative recovery.  The main complaint at the moment is the presence of constipation.  I encouraged her to take MiraLAX more often as these may improve her constipation more.  Ultimately, if she is unable to improve with this medication and with the use of dietary changes, we will need to start stronger laxative such as Linzess 72 mcg every day.  Patient understood and agreed.  Finally, patient had mild transient increase in her aminotransferases and total bilirubin during her most recent hospitalization.  It is possible that this is related to the presence of fatty liver.  However, she had normal liver enzymes prior to her hospitalization, we will recheck a repeat CMP to assess if this enzymes have remained elevated.  She was advised to follow a Mediterranean diet compliantly to improve her weight and decrease the liver steatosis.  Patient understood and agreed.  -Start taking Miralax 1 capful every 12 hours. If after two weeks there is no improvement, increase to 1 capful every 8 hours -Check CMP - Patient was counseled about the benefit of implementing a Mediterranean diet and exercise plan to decrease at least 5% of weight. The patient understood about the importance of lifestyle changes to potentially reverse liver involvement.  All questions were answered.      Harvel Quale, MD Gastroenterology and Hepatology [facility]

## 2021-06-27 ENCOUNTER — Encounter (INDEPENDENT_AMBULATORY_CARE_PROVIDER_SITE_OTHER): Payer: Self-pay | Admitting: *Deleted

## 2021-06-27 LAB — COMPREHENSIVE METABOLIC PANEL
AG Ratio: 1.2 (calc) (ref 1.0–2.5)
ALT: 12 U/L (ref 6–29)
AST: 12 U/L (ref 10–35)
Albumin: 4.2 g/dL (ref 3.6–5.1)
Alkaline phosphatase (APISO): 108 U/L (ref 37–153)
BUN: 14 mg/dL (ref 7–25)
CO2: 28 mmol/L (ref 20–32)
Calcium: 9.9 mg/dL (ref 8.6–10.4)
Chloride: 105 mmol/L (ref 98–110)
Creat: 0.69 mg/dL (ref 0.50–1.03)
Globulin: 3.6 g/dL (calc) (ref 1.9–3.7)
Glucose, Bld: 90 mg/dL (ref 65–139)
Potassium: 4.1 mmol/L (ref 3.5–5.3)
Sodium: 141 mmol/L (ref 135–146)
Total Bilirubin: 0.5 mg/dL (ref 0.2–1.2)
Total Protein: 7.8 g/dL (ref 6.1–8.1)

## 2021-07-09 ENCOUNTER — Other Ambulatory Visit: Payer: Self-pay

## 2021-07-09 ENCOUNTER — Ambulatory Visit (INDEPENDENT_AMBULATORY_CARE_PROVIDER_SITE_OTHER): Payer: 59 | Admitting: General Surgery

## 2021-07-09 ENCOUNTER — Encounter: Payer: Self-pay | Admitting: General Surgery

## 2021-07-09 VITALS — BP 166/95 | HR 88 | Temp 98.9°F | Resp 14 | Ht 67.5 in | Wt 267.0 lb

## 2021-07-09 DIAGNOSIS — Z09 Encounter for follow-up examination after completed treatment for conditions other than malignant neoplasm: Secondary | ICD-10-CM

## 2021-07-10 NOTE — Progress Notes (Signed)
Subjective:     Domingo Madeira  Here for follow-up, status post incisional herniorrhaphy with mesh.  Patient states that she has progressed well.  She still has intermittent soreness along the abdominal wall, but it continues to improve. Objective:    BP (!) 166/95   Pulse 88   Temp 98.9 F (37.2 C) (Other (Comment))   Resp 14   Ht 5' 7.5" (1.715 m)   Wt 267 lb (121.1 kg)   LMP 02/27/2021 (Approximate)   SpO2 93%   BMI 41.20 kg/m   General:  alert, cooperative, and no distress  Abdomen is soft, incision well-healed.  No evidence of recurrent hernia     Assessment:    Doing well postoperatively.    Plan:   Continue increasing activity as able.  May return to work without restrictions on 07/23/2021.  Follow-up here as needed.

## 2023-04-14 IMAGING — DX DG ABDOMEN ACUTE W/ 1V CHEST
4 series · 4 of 4 positions shown · non-contrast
Comparison: CT 05/16/2021

CLINICAL DATA: Abdominal cramping with nausea

EXAM:
DG ABDOMEN ACUTE WITH 1 VIEW CHEST

[chest pa]
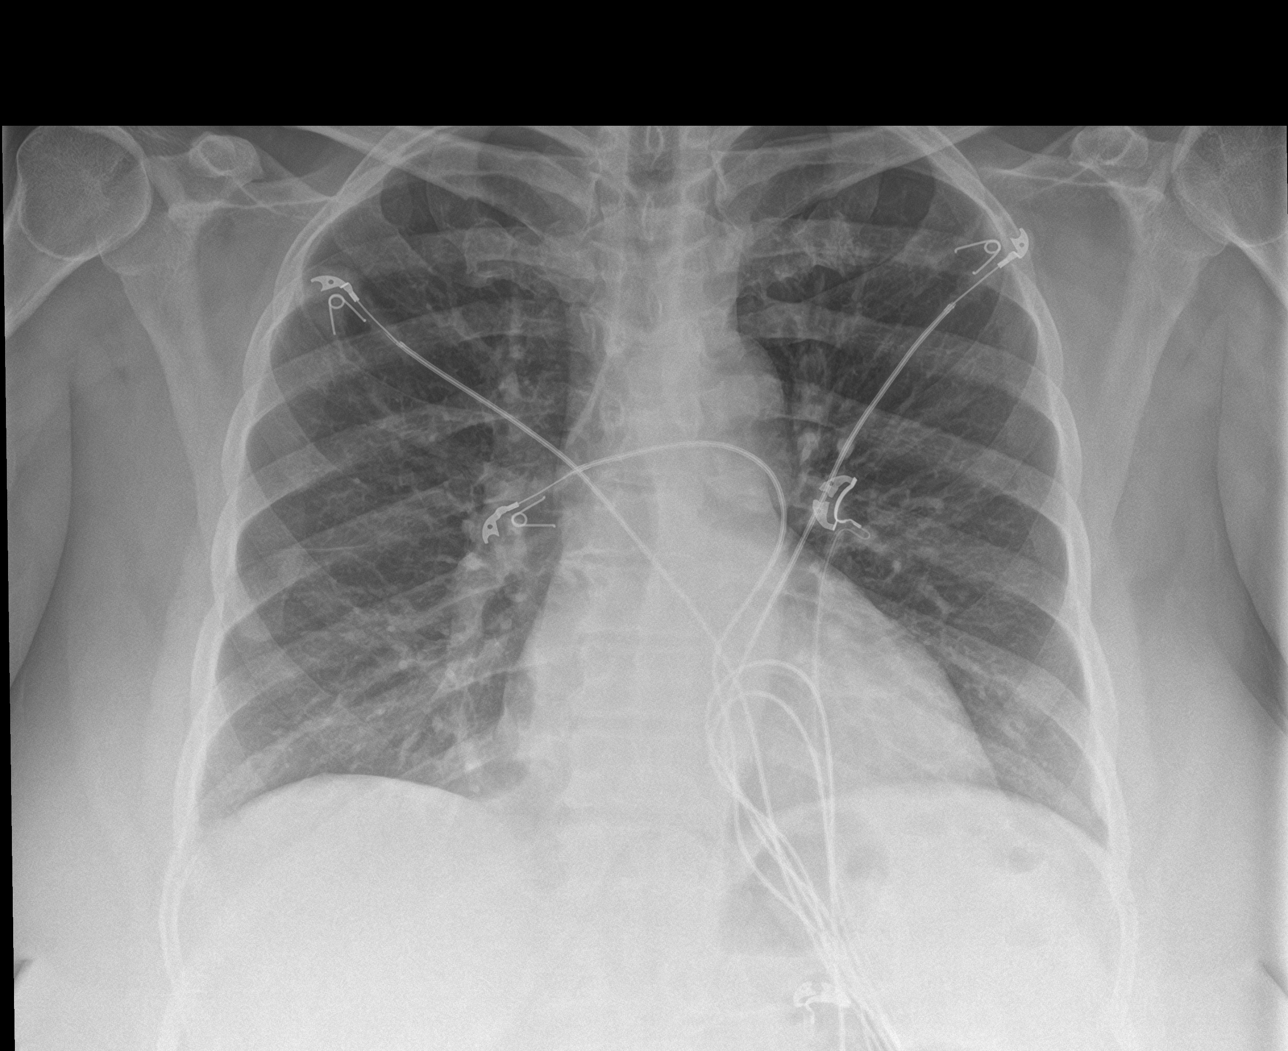

[abdomen erect]
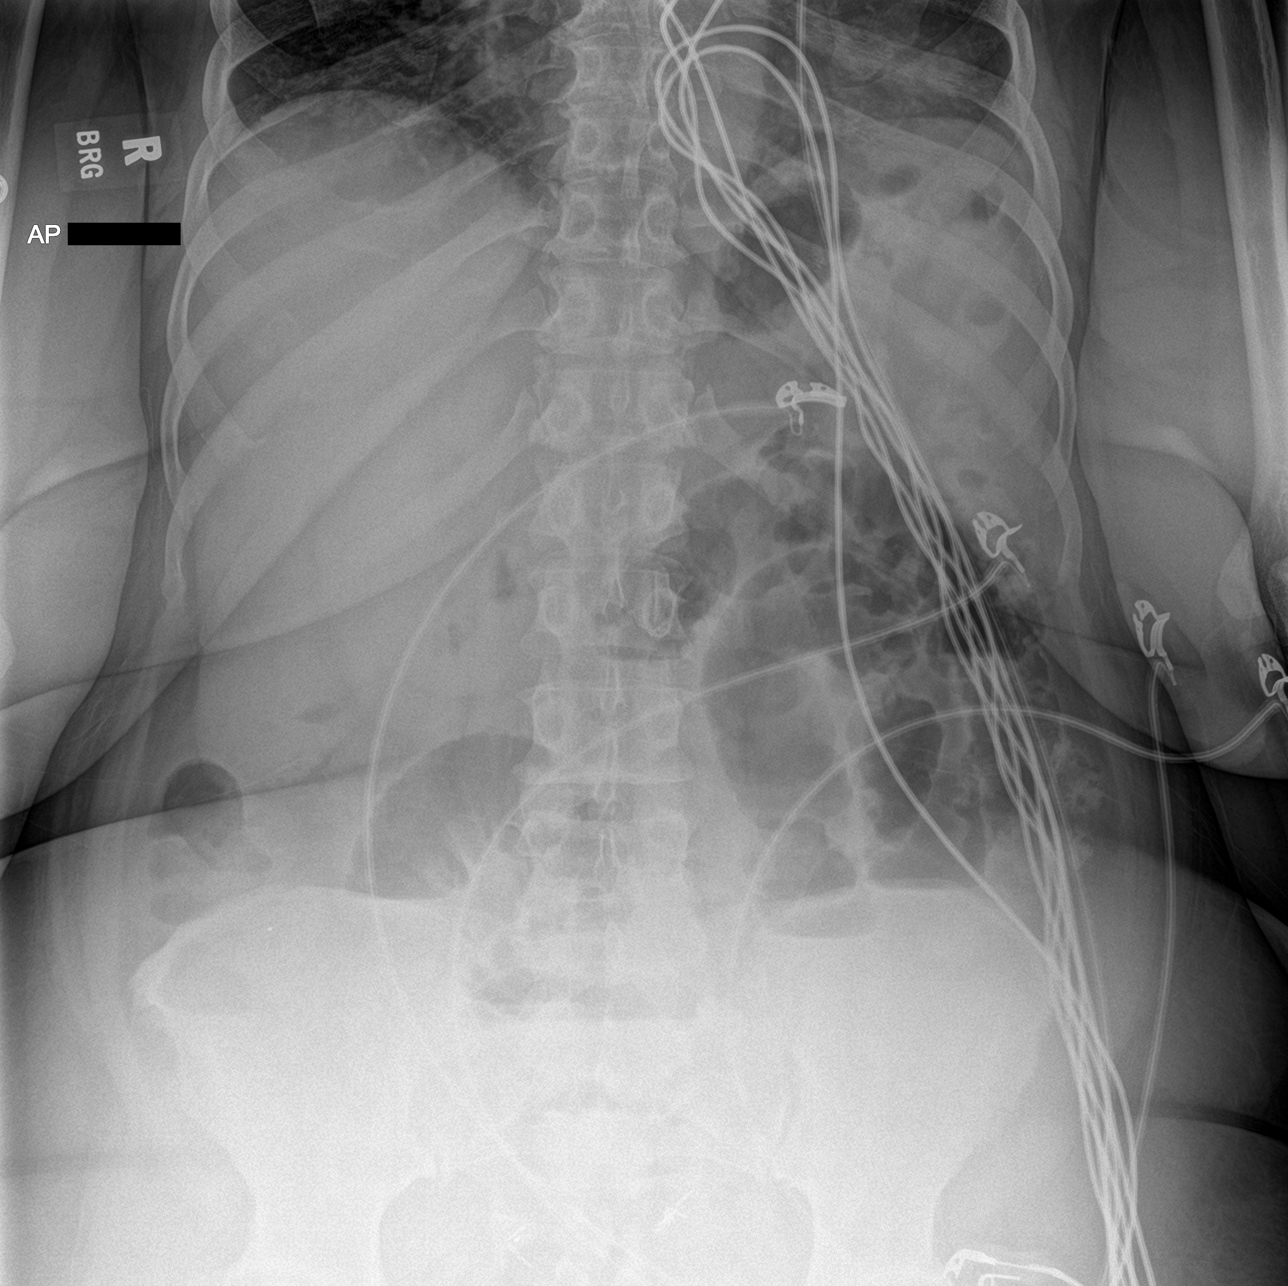

[abdomen supine (1 of 2)]
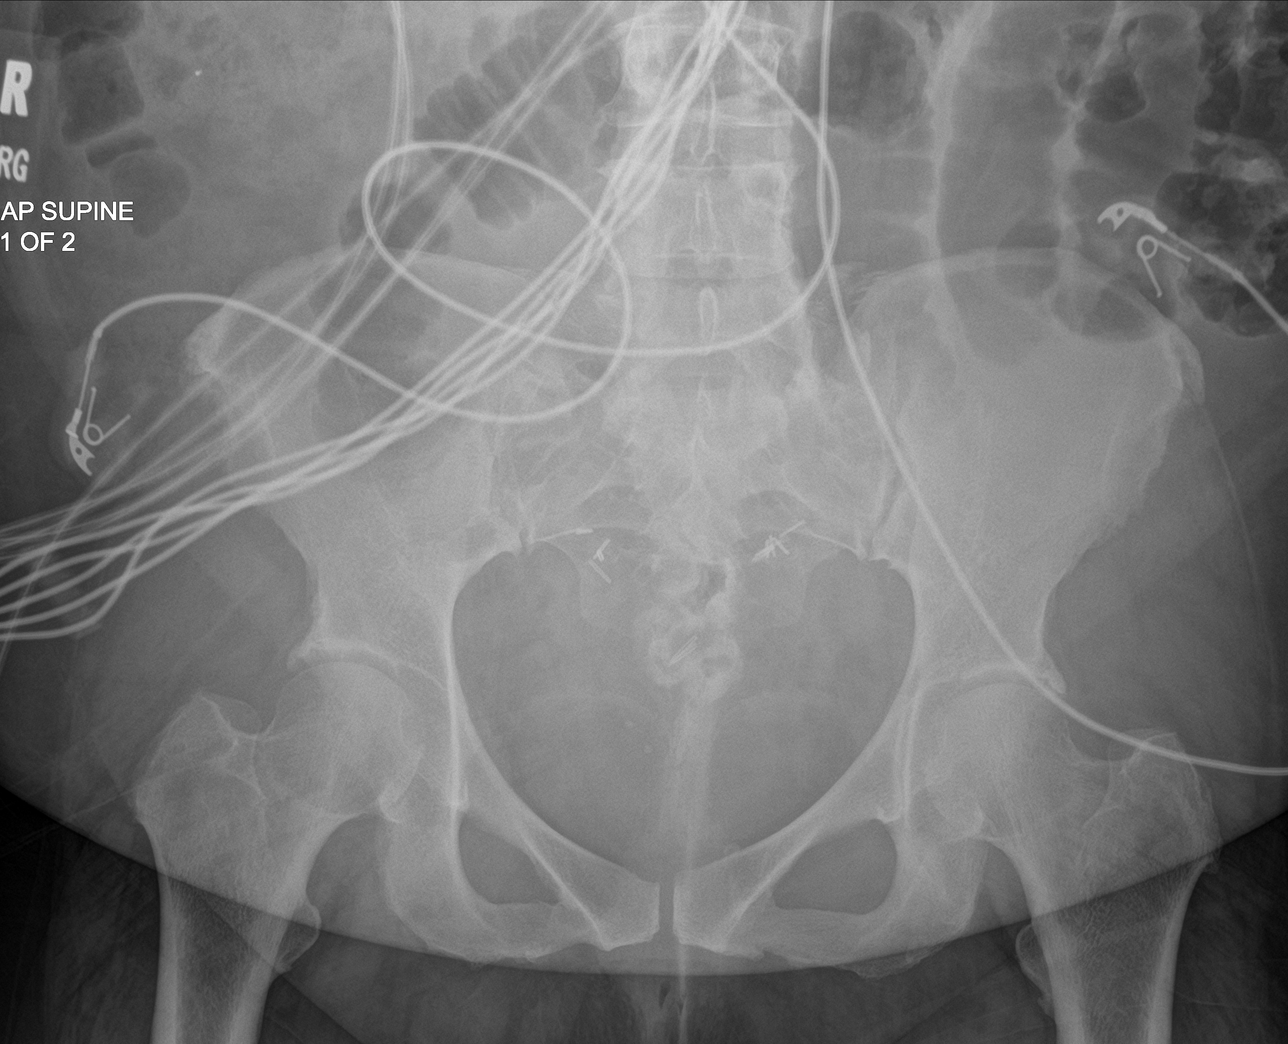

[abdomen supine (2 of 2)]
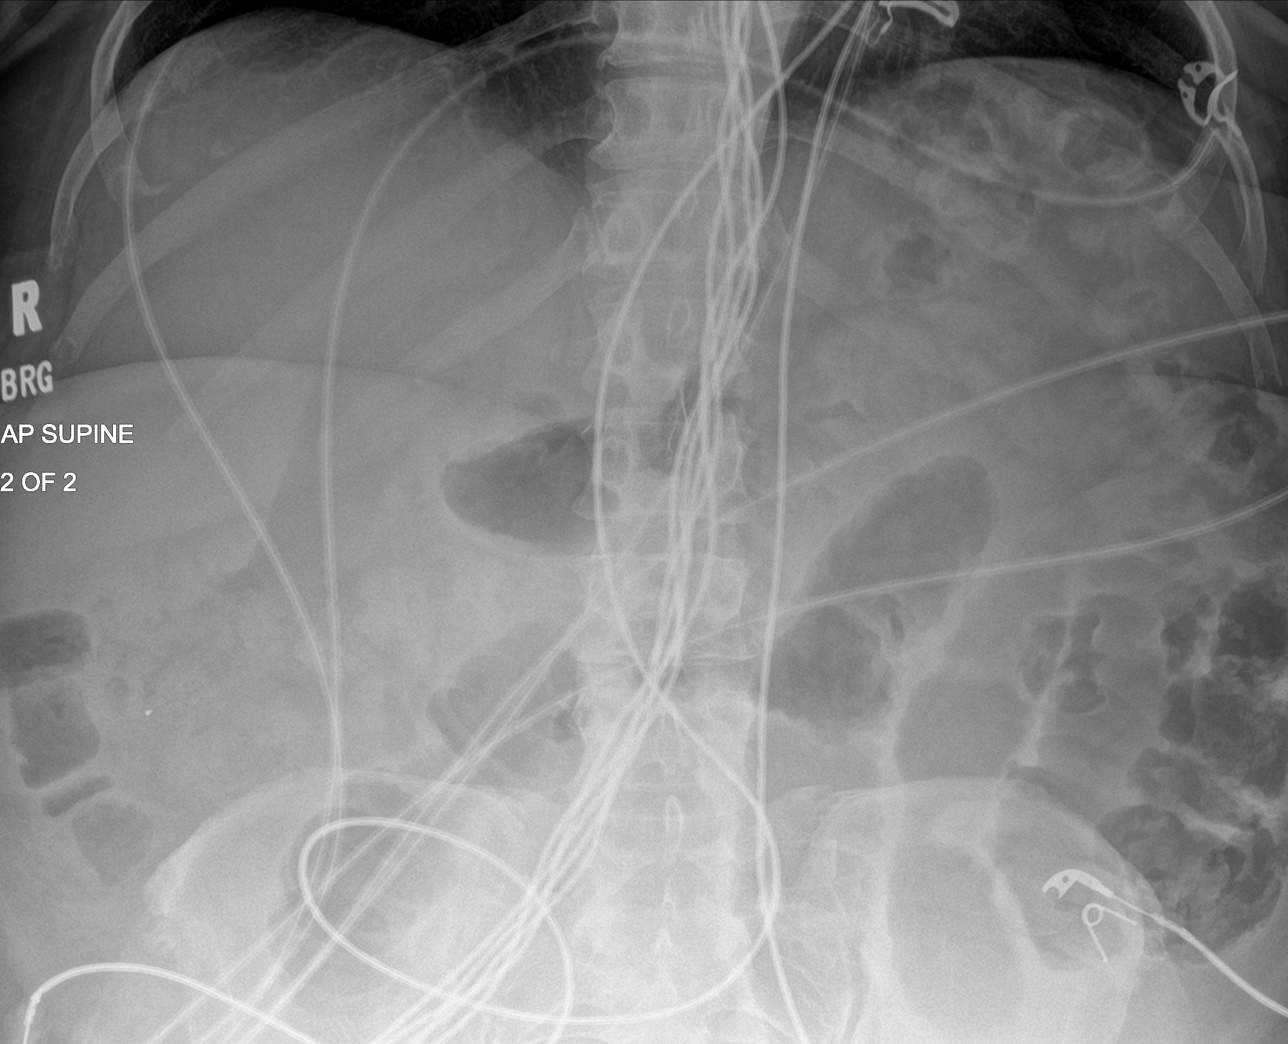

[4 of 4 positions shown; findings below may reference images not displayed]

FINDINGS: Single-view chest demonstrates no focal opacity or pleural effusion.
Normal cardiomediastinal silhouette.

Supine and upright views of the abdomen demonstrate no free air
beneath the diaphragm. Multiple dilated loops of small bowel
measuring up to 4.4 cm with multiple fluid levels on upright exam
and relative paucity of distal gas could concerning for a bowel
obstruction.
IMPRESSION: Findings suspicious for small bowel obstruction. No acute
cardiopulmonary disease.

## 2023-04-15 IMAGING — US US ABDOMEN LIMITED
1 series · 14 of 25 positions shown · non-contrast
Comparison: CT 05/20/2021.

CLINICAL DATA: Elevated LFTs.

EXAM:
ULTRASOUND ABDOMEN LIMITED RIGHT UPPER QUADRANT

[Series 1: us abdomen limited · 0.22mm/px · 14 of 41 slices shown]
[im 1/41]
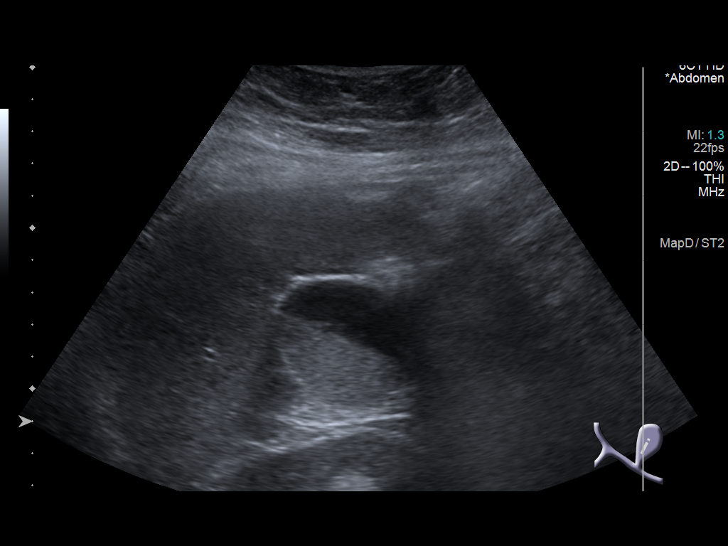
[im 4/41]
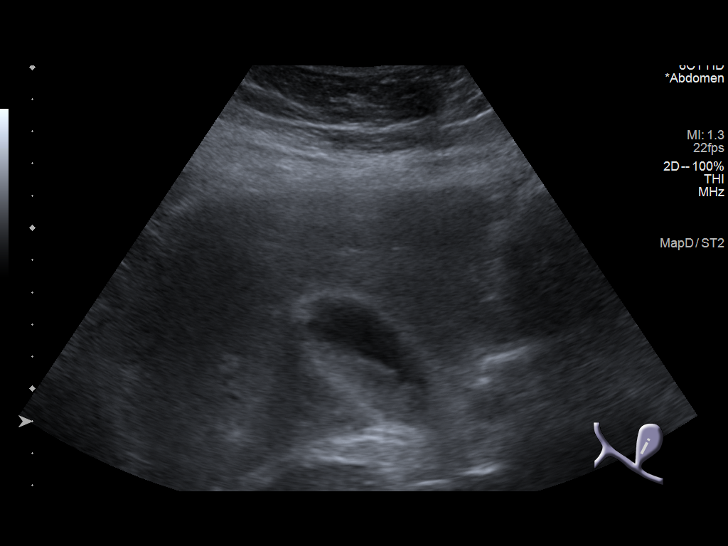
[im 7/41]
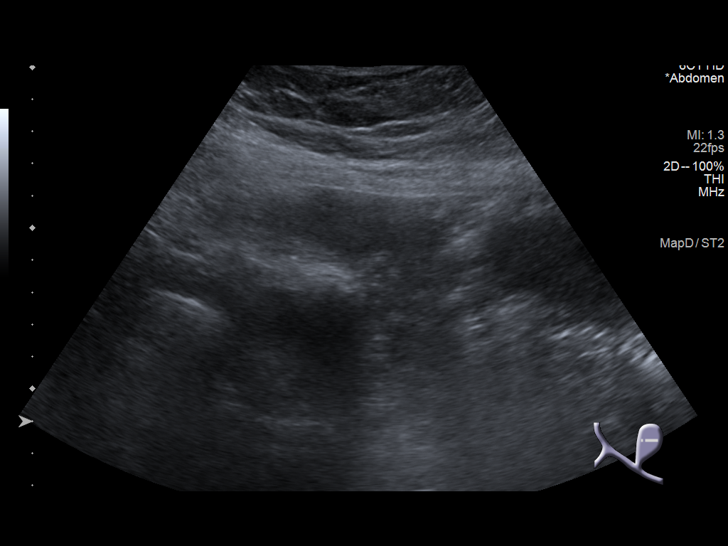
[im 11/41]
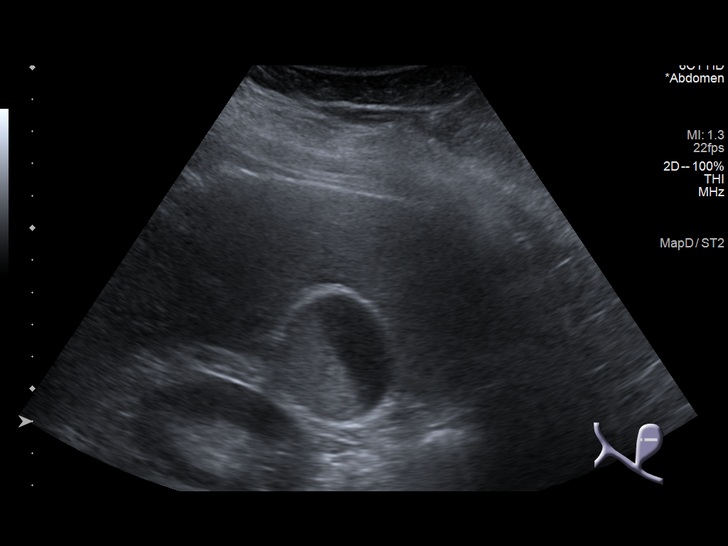
[im 14/41]
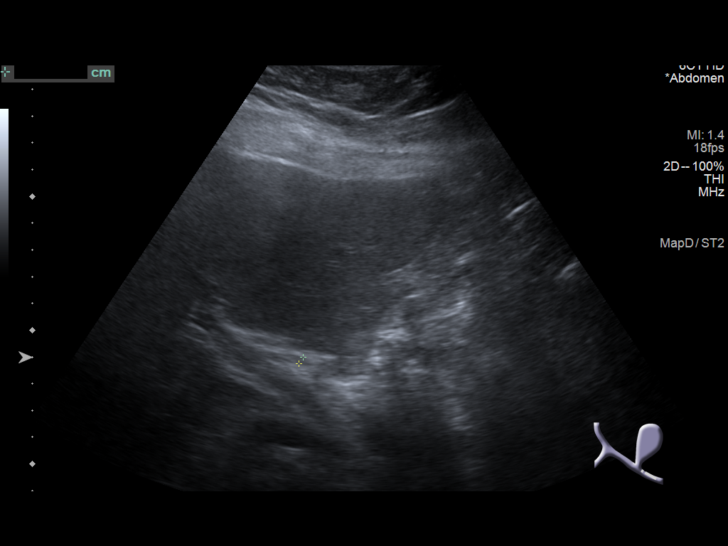
[im 16/41]
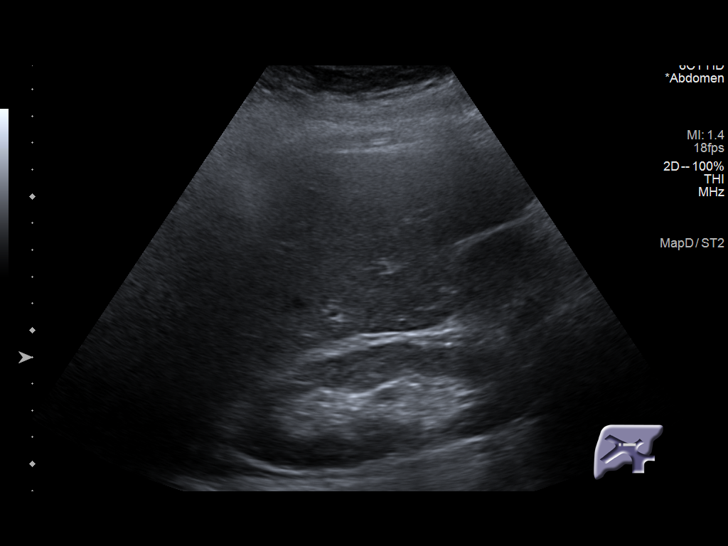
[im 19/41]
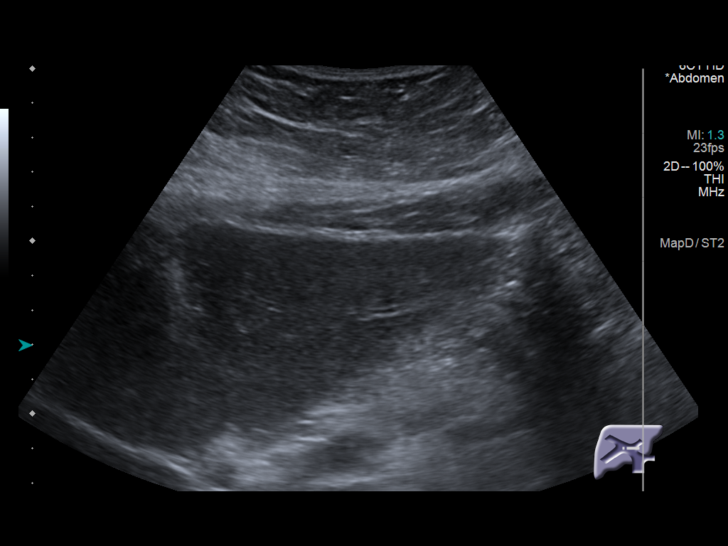
[im 22/41]
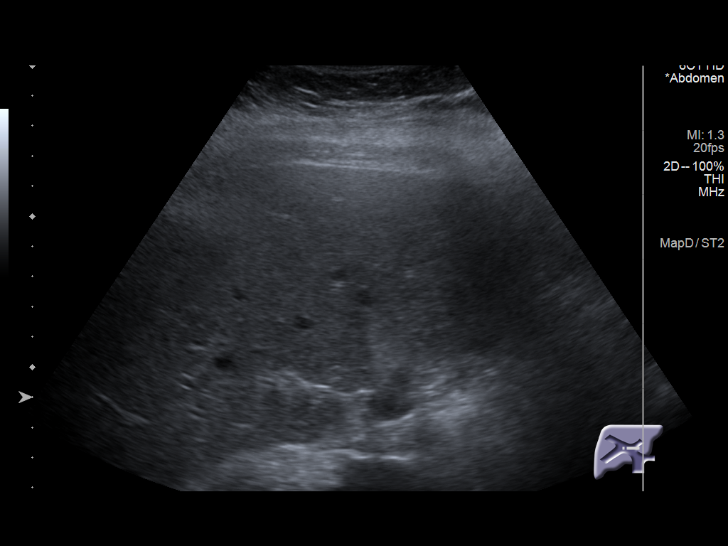
[im 26/41]
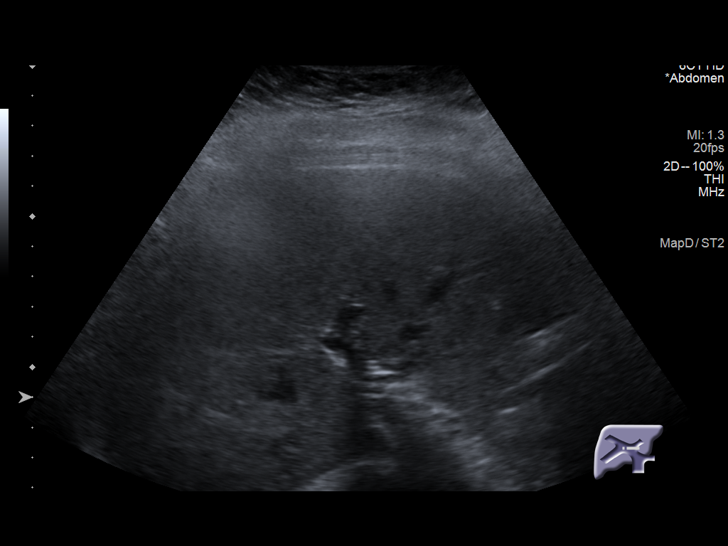
[im 27/41]
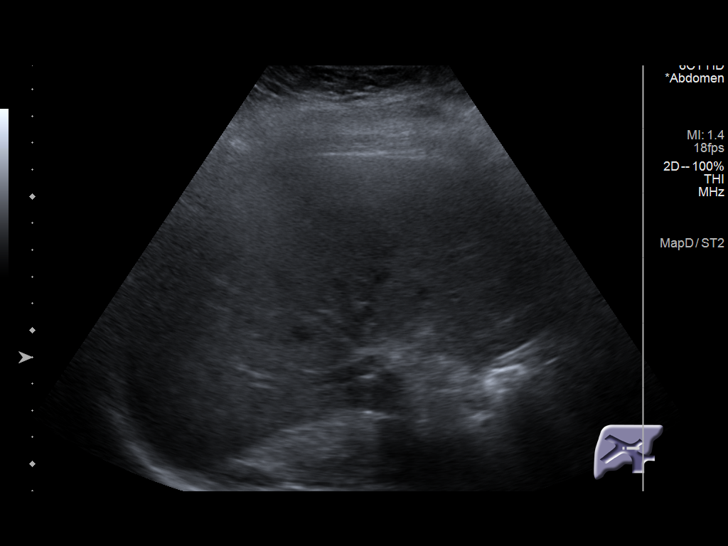
[im 31/41]
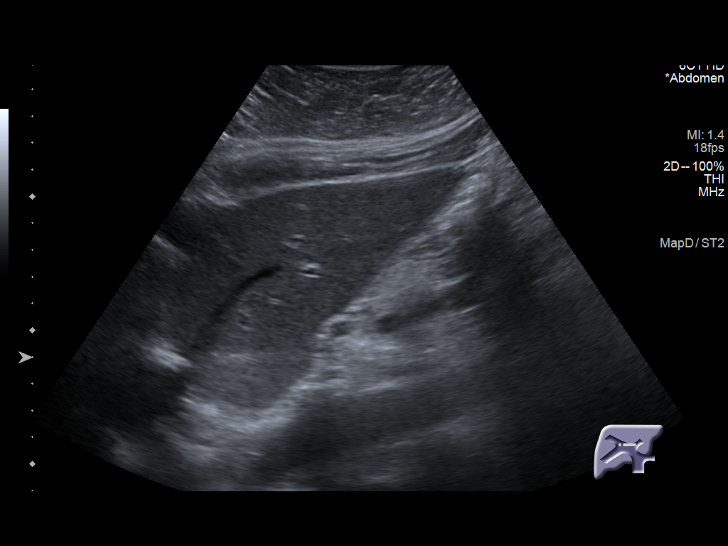
[im 34/41]
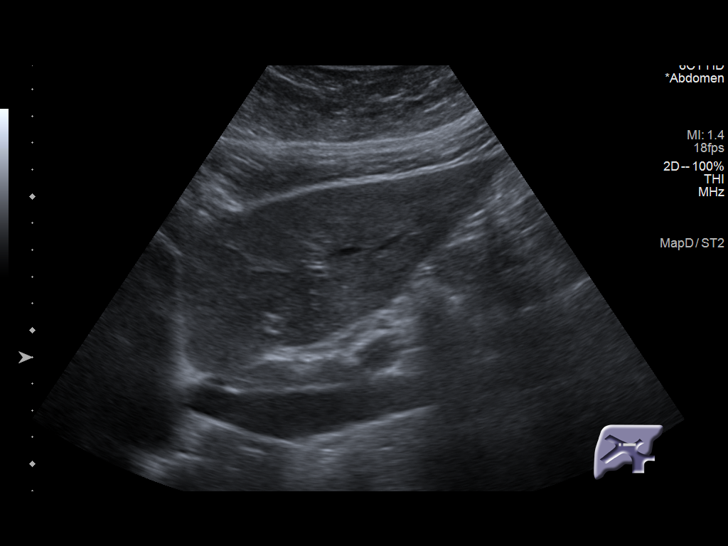
[im 37/41]
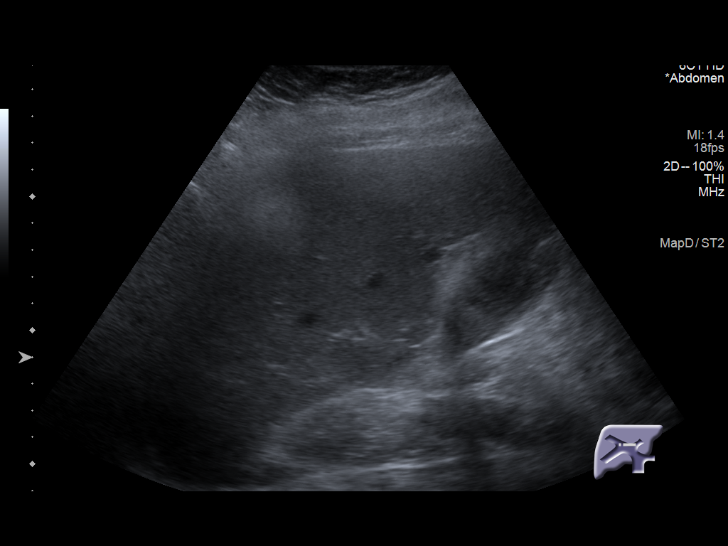
[im 41/41]
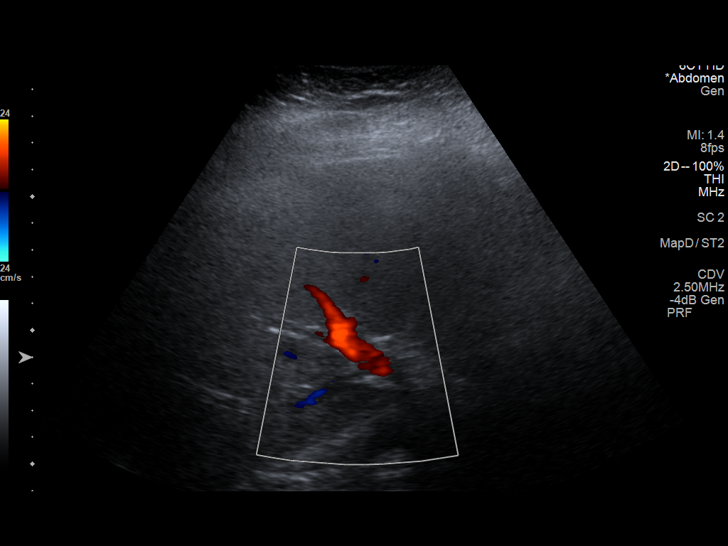

[14 of 25 positions shown; findings below may reference images not displayed]

FINDINGS: Gallbladder:

Gallstones and sludge noted. Gallbladder wall thickness 1.9 mm.
Negative Murphy sign.

Common bile duct:

Diameter: 2.9 mm

Liver:

Increased echogenicity consistent with fatty infiltration or
hepatocellular disease. No focal hepatic abnormality identified.
Portal vein is patent on color Doppler imaging with normal direction
of blood flow towards the liver.

Other: None.
IMPRESSION: 1. Gallstones and sludge noted. No evidence of cholecystitis or
biliary distention.

2. Increased hepatic echogenicity consistent fatty infiltration or
hepatocellular disease.

## 2023-04-18 IMAGING — DX DG ABDOMEN 1V
1 series · 1 of 1 positions shown · non-contrast
Comparison: None.

CLINICAL DATA: NG tube placement

EXAM:
ABDOMEN - 1 VIEW

[abdomen supine grid]
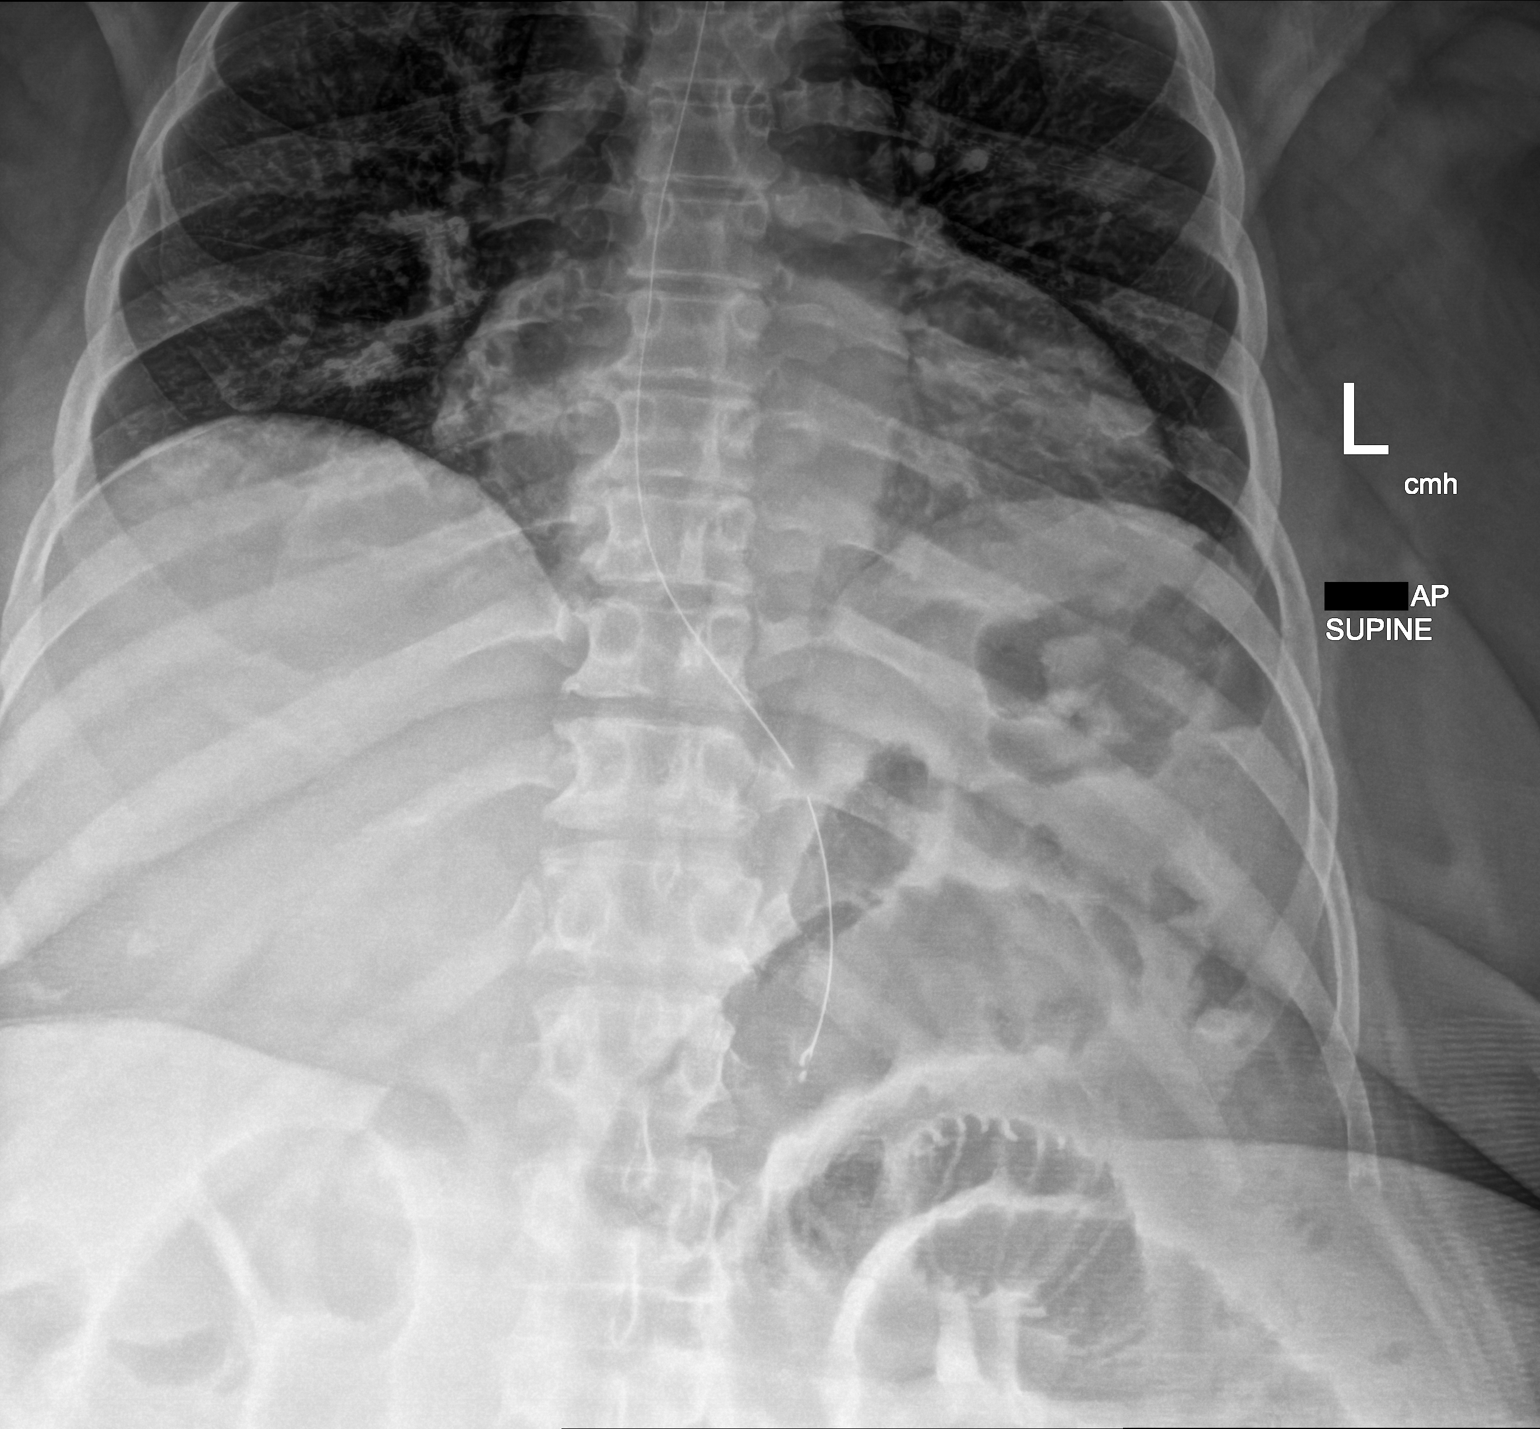

[1 of 1 positions shown; findings below may reference images not displayed]

FINDINGS: Esophagogastric tube is position with tip and side port below the
diaphragm. Distended loops of small bowel in the partially imaged
ventral abdomen, measuring up to 4.2 cm in caliber.
IMPRESSION: 1. Esophagogastric tube is position with tip and side port below the
diaphragm.
2. Distended loops of small bowel in the partially imaged ventral
abdomen, measuring up to 4.2 cm in caliber.

## 2023-04-20 IMAGING — DX DG ABD PORTABLE 1V
2 series · 2 of 2 positions shown · non-contrast
Comparison: Plain film dated [DATE].

CLINICAL DATA: Ileus after hernia repair surgery on [REDACTED].

EXAM:
PORTABLE ABDOMEN - 1 VIEW

[abdomen supine grid (1 of 2)]
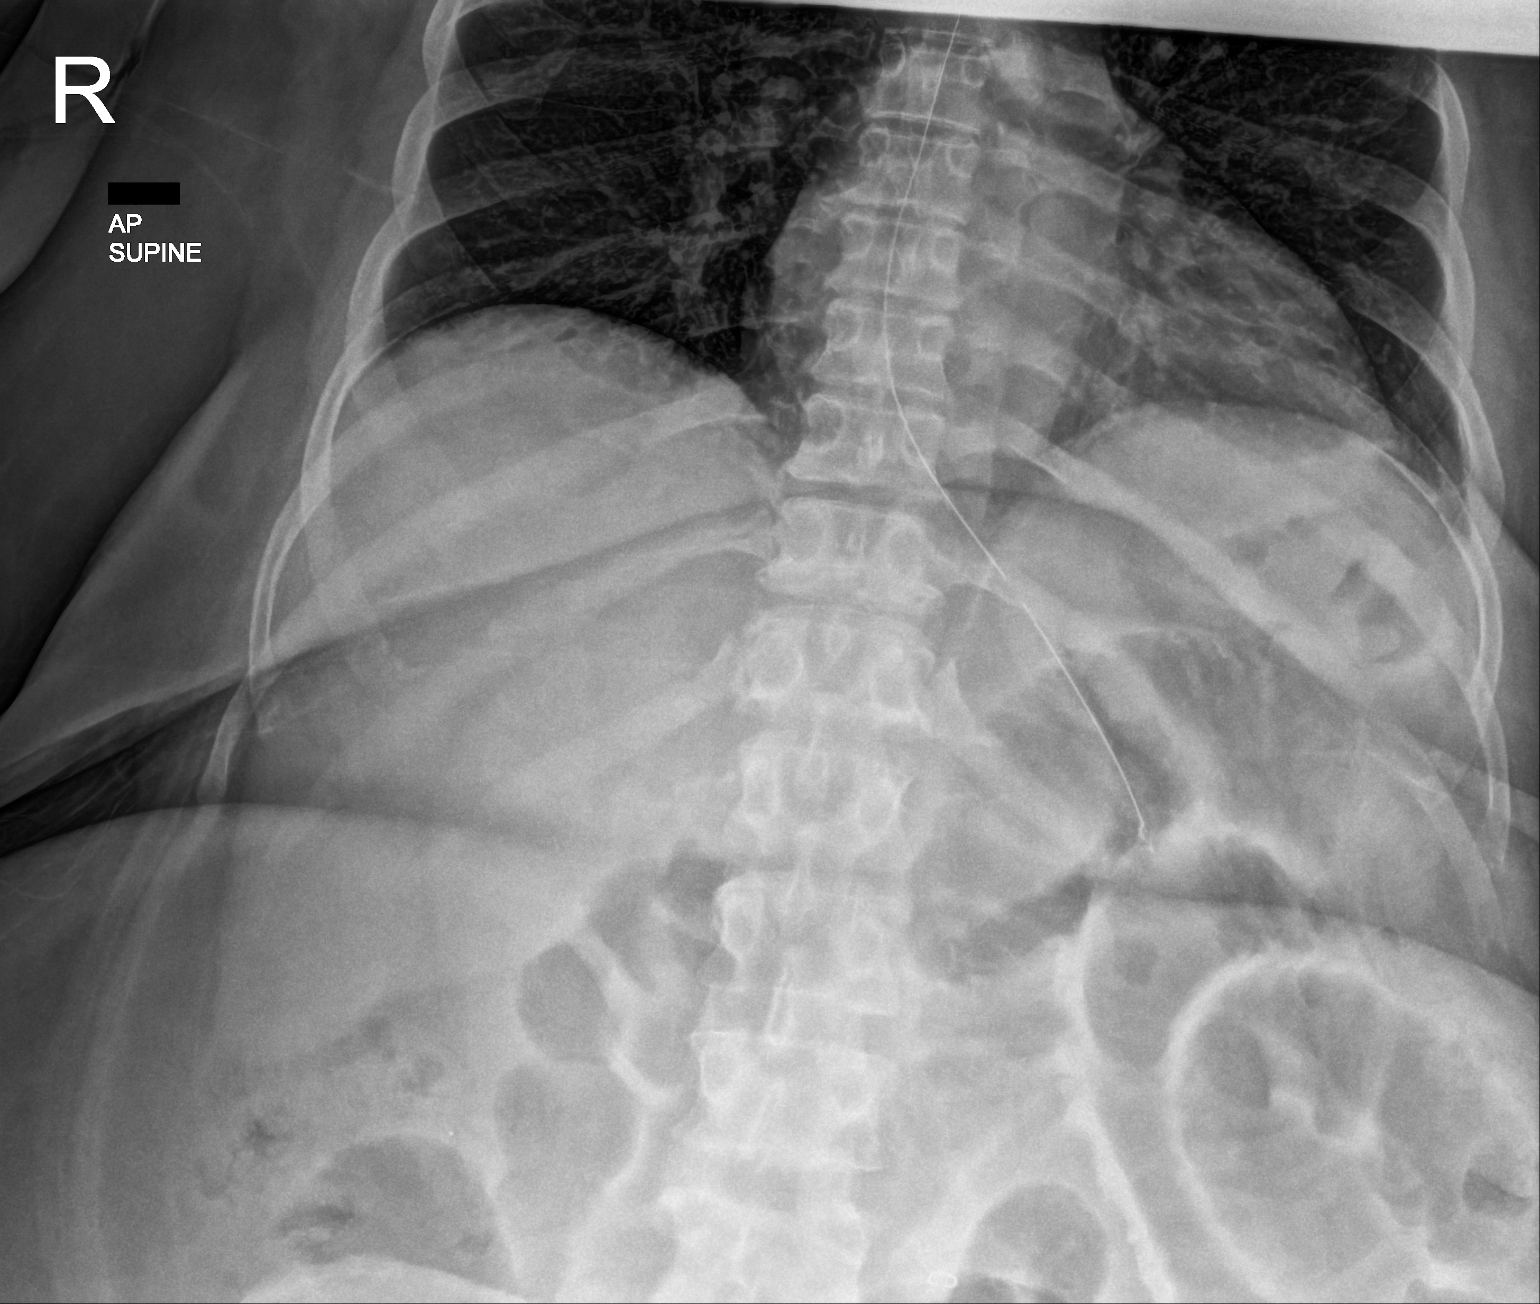

[abdomen supine grid (2 of 2)]
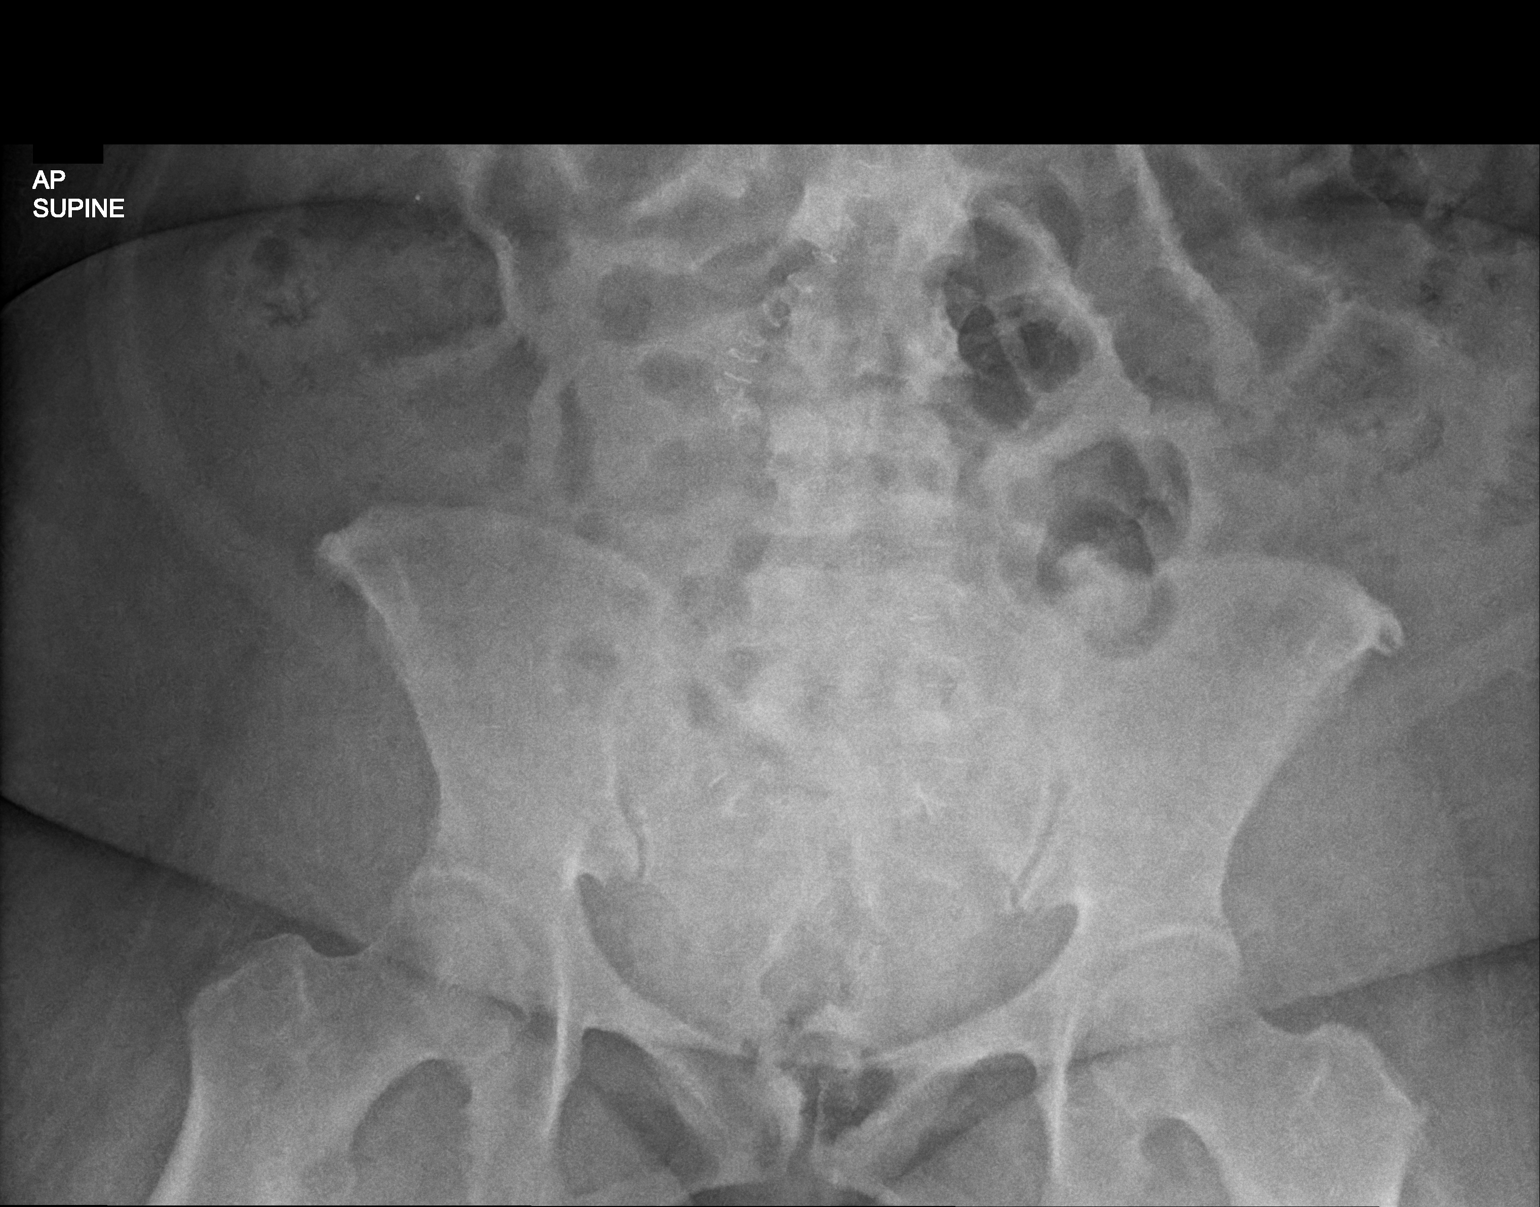

[2 of 2 positions shown; findings below may reference images not displayed]

FINDINGS: Enteric tube in the stomach. Continued dilatation of the gas-filled
small bowel loops in the LEFT abdomen, although perhaps minimally
improved compared to yesterday's exam. Surgical staples are seen
over the midline abdomen. Lung bases are clear.
IMPRESSION: Continued evidence of partial small bowel obstruction versus ileus,
although perhaps minimally improved compared to yesterday's exam.
Enteric tube in the stomach.

## 2024-04-04 ENCOUNTER — Ambulatory Visit: Admitting: Obstetrics & Gynecology

## 2024-04-04 ENCOUNTER — Encounter: Payer: Self-pay | Admitting: Obstetrics & Gynecology

## 2024-04-04 VITALS — BP 141/94 | HR 82 | Ht 67.5 in

## 2024-04-04 DIAGNOSIS — R87610 Atypical squamous cells of undetermined significance on cytologic smear of cervix (ASC-US): Secondary | ICD-10-CM

## 2024-04-04 NOTE — Progress Notes (Signed)
 Follow up appointment for results: Pap test  Chief Complaint  Patient presents with   Follow-up    On pap smear    Blood pressure (!) 141/94, pulse 82, height 5' 7.5" (1.715 m), last menstrual period 02/27/2021.  I did her hysterectomy in 04/2021 3077 gram uterus, supracervical  Had Pap test and they asked for evaluation  ASCUS negative HPV which is a normal Pap test and can be repeated in 3 years  MEDS ordered this encounter: No orders of the defined types were placed in this encounter.   Orders for this encounter: No orders of the defined types were placed in this encounter.   Impression + Management Plan   ICD-10-CM   1. ASCUS of cervix with negative high risk HPV: repeat Pap in 3 years  R87.610       Follow Up: Return in about 3 years (around 04/05/2027) for HPV based cytology.     All questions were answered.  Past Medical History:  Diagnosis Date   Anemia    Complication of anesthesia    Patient woke up during procedure    Past Surgical History:  Procedure Laterality Date   BILATERAL SALPINGECTOMY Bilateral 05/01/2021   Procedure: OPEN BILATERAL SALPINGECTOMY and OOPHORECTOMY;  Surgeon: Wendelyn Halter, MD;  Location: [facility];  Service: Gynecology;  Laterality: Bilateral;   COLONOSCOPY     ENDOMETRIAL BIOPSY     IUD REMOVAL     LAPAROSCOPIC LYSIS OF ADHESIONS N/A 05/22/2021   Procedure: LYSIS OF ADHESIONS;  Surgeon: Alanda Allegra, MD;  Location: [facility];  Service: General;  Laterality: N/A;   SUPRACERVICAL ABDOMINAL HYSTERECTOMY N/A 05/01/2021   Procedure: HYSTERECTOMY SUPRACERVICAL ABDOMINAL;  Surgeon: Wendelyn Halter, MD;  Location: [facility];  Service: Gynecology;  Laterality: N/A;   VENTRAL HERNIA REPAIR N/A 05/22/2021   Procedure: VENTRAL HERNIORRHAPHY WITH MESH;  Surgeon: Alanda Allegra, MD;  Location: [facility];  Service: General;  Laterality: N/A;    OB History     Gravida  0   Para  0   Term  0   Preterm  0   AB  0   Living  0      SAB  0    IAB  0   Ectopic  0   Multiple  0   Live Births  0           Allergies  Allergen Reactions   Codeine Anaphylaxis   Iron Sucrose Rash   Azithromycin    Milk-Related Compounds Other (See Comments)    Stomach pain   Other Dermatitis    Certain metals     Social History   Socioeconomic History   Marital status: Single    Spouse name: Not on file   Number of children: Not on file   Years of education: Not on file   Highest education level: Not on file  Occupational History   Not on file  Tobacco Use   Smoking status: Never   Smokeless tobacco: Never  Vaping Use   Vaping status: Never Used  Substance and Sexual Activity   Alcohol use: Not Currently   Drug use: Never   Sexual activity: Not Currently    Birth control/protection: Surgical    Comment: suprcervical hyst  Other Topics Concern   Not on file  Social History Narrative   Not on file   Social Drivers of Health   Financial Resource Strain: Medium Risk (02/13/2021)   Overall Financial Resource Strain (CARDIA)  Difficulty of Paying Living Expenses: Somewhat hard  Food Insecurity: No Food Insecurity (02/13/2021)   Hunger Vital Sign    Worried About Running Out of Food in the Last Year: Never true    Ran Out of Food in the Last Year: Never true  Transportation Needs: Unmet Transportation Needs (02/13/2021)   PRAPARE - Transportation    Lack of Transportation (Medical): Yes    Lack of Transportation (Non-Medical): Yes  Physical Activity: Insufficiently Active (02/13/2021)   Exercise Vital Sign    Days of Exercise per Week: 2 days    Minutes of Exercise per Session: 30 min  Stress: No Stress Concern Present (02/13/2021)   [employer] of Occupational Health - Occupational Stress Questionnaire    Feeling of Stress : Only a little  Social Connections: Moderately Isolated (02/13/2021)   Social Connection and Isolation Panel [NHANES]    Frequency of Communication with Friends and Family: More than three  times a week    Frequency of Social Gatherings with Friends and Family: Twice a week    Attends Religious Services: More than 4 times per year    Active Member of [employer] or Organizations: No    Attends Engineer, structural: Never    Marital Status: Never married    Family History  Problem Relation Age of Onset   Heart attack Mother    Diabetes Father    Hypertension Father    Alcohol abuse Father    Diabetes Maternal Grandfather    Diabetes Paternal Grandmother    Diabetes Paternal Grandfather    Colon cancer Neg Hx
